# Patient Record
Sex: Male | Born: 1970
Health system: Southern US, Community
[De-identification: ages and names within clinical notes are randomized; demographics above are authoritative.]

---

## 2002-08-08 ENCOUNTER — Encounter: Payer: Self-pay | Admitting: *Deleted

## 2002-08-08 ENCOUNTER — Ambulatory Visit (HOSPITAL_COMMUNITY): Admission: RE | Admit: 2002-08-08 | Discharge: 2002-08-08 | Payer: Self-pay | Admitting: *Deleted

## 2002-10-18 ENCOUNTER — Inpatient Hospital Stay (HOSPITAL_COMMUNITY): Admission: RE | Admit: 2002-10-18 | Discharge: 2002-10-23 | Payer: Self-pay | Admitting: Neurosurgery

## 2002-10-18 ENCOUNTER — Encounter: Payer: Self-pay | Admitting: Neurosurgery

## 2017-05-05 ENCOUNTER — Other Ambulatory Visit: Payer: Self-pay | Admitting: Sports Medicine

## 2017-05-05 DIAGNOSIS — D369 Benign neoplasm, unspecified site: Secondary | ICD-10-CM

## 2017-05-05 DIAGNOSIS — M546 Pain in thoracic spine: Secondary | ICD-10-CM

## 2017-05-19 ENCOUNTER — Ambulatory Visit
Admission: RE | Admit: 2017-05-19 | Discharge: 2017-05-19 | Disposition: A | Discharge status: Home / Self Care | Payer: Self-pay | Source: Ambulatory Visit | Attending: Sports Medicine | Admitting: Sports Medicine

## 2017-05-19 DIAGNOSIS — M546 Pain in thoracic spine: Secondary | ICD-10-CM

## 2017-05-19 DIAGNOSIS — D369 Benign neoplasm, unspecified site: Secondary | ICD-10-CM

## 2017-05-19 MED ORDER — GADOBENATE DIMEGLUMINE 529 MG/ML IV SOLN
18.0000 mL | Freq: Once | INTRAVENOUS | Status: AC | PRN
  Administered 2017-05-19: 18 mL via INTRAVENOUS

## 2017-06-09 ENCOUNTER — Other Ambulatory Visit: Payer: Self-pay | Admitting: Neurological Surgery

## 2017-06-09 DIAGNOSIS — M545 Low back pain: Secondary | ICD-10-CM

## 2017-06-24 ENCOUNTER — Ambulatory Visit
Admission: RE | Admit: 2017-06-24 | Discharge: 2017-06-24 | Disposition: A | Discharge status: Home / Self Care | Payer: 59 | Source: Ambulatory Visit | Attending: Neurological Surgery | Admitting: Neurological Surgery

## 2017-06-24 DIAGNOSIS — M545 Low back pain: Secondary | ICD-10-CM

## 2017-06-24 MED ORDER — GADOBENATE DIMEGLUMINE 529 MG/ML IV SOLN
17.0000 mL | Freq: Once | INTRAVENOUS | Status: AC | PRN
  Administered 2017-06-24: 17 mL via INTRAVENOUS

## 2018-05-15 ENCOUNTER — Telehealth: Payer: Self-pay | Admitting: Psychiatry

## 2018-05-15 DIAGNOSIS — F3341 Major depressive disorder, recurrent, in partial remission: Secondary | ICD-10-CM

## 2018-05-15 MED ORDER — VILAZODONE HCL 40 MG PO TABS: 40.0000 mg | ORAL_TABLET | Freq: Every evening | ORAL | 1 refills | Status: DC

## 2018-05-15 NOTE — Telephone Encounter (Signed)
Patient reports pharmacy or office are confused about his Viibryd needing refill 40 mg nightly as a 90-day supply and 1 refill sent to [name] for interim to next appointment, with no other concerns or complaints.

## 2018-05-30 ENCOUNTER — Encounter: Payer: Self-pay | Admitting: Emergency Medicine

## 2018-05-30 DIAGNOSIS — F5081 Binge eating disorder: Secondary | ICD-10-CM

## 2018-05-30 DIAGNOSIS — G4700 Insomnia, unspecified: Secondary | ICD-10-CM

## 2018-05-30 DIAGNOSIS — F3341 Major depressive disorder, recurrent, in partial remission: Secondary | ICD-10-CM

## 2018-05-30 DIAGNOSIS — F5105 Insomnia due to other mental disorder: Secondary | ICD-10-CM | POA: Insufficient documentation

## 2018-05-30 DIAGNOSIS — F50819 Binge eating disorder, unspecified: Secondary | ICD-10-CM

## 2018-05-30 DIAGNOSIS — F331 Major depressive disorder, recurrent, moderate: Secondary | ICD-10-CM | POA: Insufficient documentation

## 2018-05-30 DIAGNOSIS — F411 Generalized anxiety disorder: Secondary | ICD-10-CM

## 2018-05-30 DIAGNOSIS — F330 Major depressive disorder, recurrent, mild: Secondary | ICD-10-CM | POA: Insufficient documentation

## 2018-06-05 ENCOUNTER — Ambulatory Visit: Payer: Self-pay | Admitting: Psychiatry

## 2018-06-09 ENCOUNTER — Other Ambulatory Visit: Payer: Self-pay | Admitting: Psychiatry

## 2018-06-14 ENCOUNTER — Other Ambulatory Visit: Payer: Self-pay | Admitting: Psychiatry

## 2018-06-14 ENCOUNTER — Encounter: Payer: Self-pay | Admitting: Psychiatry

## 2018-06-14 ENCOUNTER — Ambulatory Visit (INDEPENDENT_AMBULATORY_CARE_PROVIDER_SITE_OTHER): Payer: 59 | Admitting: Psychiatry

## 2018-06-14 VITALS — BP 104/72 | HR 68 | Ht 72.0 in | Wt 210.0 lb

## 2018-06-14 DIAGNOSIS — F5081 Binge eating disorder: Secondary | ICD-10-CM | POA: Insufficient documentation

## 2018-06-14 DIAGNOSIS — G4700 Insomnia, unspecified: Secondary | ICD-10-CM | POA: Diagnosis not present

## 2018-06-14 DIAGNOSIS — F5082 Avoidant/restrictive food intake disorder: Secondary | ICD-10-CM | POA: Diagnosis not present

## 2018-06-14 DIAGNOSIS — F411 Generalized anxiety disorder: Secondary | ICD-10-CM

## 2018-06-14 DIAGNOSIS — F5105 Insomnia due to other mental disorder: Secondary | ICD-10-CM

## 2018-06-14 DIAGNOSIS — F3341 Major depressive disorder, recurrent, in partial remission: Secondary | ICD-10-CM

## 2018-06-14 MED ORDER — ZOLPIDEM TARTRATE 10 MG PO TABS: 10.0000 mg | ORAL_TABLET | Freq: Every day | ORAL | 1 refills | Status: DC

## 2018-06-14 MED ORDER — VILAZODONE HCL 40 MG PO TABS: 40.0000 mg | ORAL_TABLET | Freq: Every evening | ORAL | 1 refills | Status: DC

## 2018-06-14 MED ORDER — BUSPIRONE HCL 30 MG PO TABS: 30.0000 mg | ORAL_TABLET | Freq: Two times a day (BID) | ORAL | 1 refills | Status: DC

## 2018-06-14 MED ORDER — ALPRAZOLAM 1 MG PO TABS: 1.0000 mg | ORAL_TABLET | Freq: Four times a day (QID) | ORAL | 1 refills | Status: DC | PRN

## 2018-06-14 NOTE — Progress Notes (Signed)
[facility]  Patient ID: Mario Briggs,  MRN: [id number]  PCP: Suzanna Obey, MD  Date of Evaluation: 06/14/2018 Time spent:20 minutes  Chief Complaint:  Chief Complaint    Anxiety; Depression; Eating Disorder      HISTORY/CURRENT STATUS: Mario Briggs is seen individually face-to-face with consent without collateral for psychiatric interview and exam in 29-month evaluation and management of anxiety, depression, insomnia, and eating disorder symptoms now fluctuating again.  Overall he is active and comfortable in the lobby and office today apparently texting wife Mario Briggs who is under the weather and unable to attend.  He clarifies that she is comfortable with the patient's treatment now, including that he has stopped his Latuda 10 mg nightly in the interim 2 months as stabilization of the cognitive dysphoria and anxiety have been achieved and the medication may render loss of control of appetite becoming difficult.  He states in the office today he is just thinking about when he can get home for cake and ice cream.  They are not cooking meals significantly, but his weight is stable being up 1 pound over last visit.  He has improved self-control and cognitive processing, while clarifying that he and Mario Briggs are often fused in their problem so that they can help each other but they also may simultaneously get worse at times.  He needs refills at least on Xanax and [facility]ien [facility] and has improved his anxiety and depression.  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  Anxiety  Presents for follow-up visit. Symptoms include compulsions, excessive worry and insomnia. Patient reports no chest pain, confusion, decreased concentration, depressed mood, dizziness, dry mouth, feeling of choking, hyperventilation, impotence, irritability, malaise, muscle  tension, nausea, nervous/anxious behavior, obsessions, palpitations, panic, restlessness, shortness of breath or suicidal ideas. Symptoms occur occasionally. The most recent episode lasted 2 hours. The severity of symptoms is mild. The patient sleeps 6 hours per night. The quality of sleep is fair. Nighttime awakenings: several.   His past medical history is significant for depression. There is no history of suicide attempts. Compliance with medications is 76-100%. Side effects of treatment include joint pain.  Depression       The patient presents with depression.  This is a recurrent problem.  The current episode started more than 1 year ago.   The onset quality is sudden.   The problem occurs intermittently.  The most recent episode lasted 6 weeks.    The problem has been gradually improving since onset.  Associated symptoms include insomnia and appetite change.  Associated symptoms include no decreased concentration, no fatigue, no helplessness, no hopelessness, not irritable, no restlessness, no decreased interest, no body aches, no myalgias, no headaches, no indigestion, not sad and no suicidal ideas.     The symptoms are aggravated by work stress, social issues and family issues.  Past treatments include SSRIs - Selective serotonin reuptake inhibitors and other medications.  Compliance with treatment is good.  Past compliance problems include difficulty with treatment plan, medication issues and medical issues.  Previous treatment provided significant relief.  Risk factors include a change in medication usage/dosage, family history of mental illness, family violence, major life event and stress.   Past medical history includes chronic illness, brain trauma, anxiety, eating disorder, depression and mental health disorder.     Pertinent negatives include no chronic fatigue  syndrome, no chronic pain, no fibromyalgia, no hypothyroidism, no thyroid problem, no recent illness, no life-threatening condition, no  physical disability, no terminal illness, no recent psychiatric admission, no Alzheimer's disease, no dementia, no bipolar disorder, no obsessive-compulsive disorder, no post-traumatic stress disorder, no schizophrenia, no suicide attempts and no head trauma.   Individual Medical History/ Review of Systems: Changes? :Yes .  Allergic rhinitis with eustachian tube dysfunction is more stable now with the cold weather.  Eyeglasses are unchanged.  Night sweats are now resolving as weight is more stable, slightly overweight but at his best in a long time, still with urge to binge eat but not restricting or avoiding lately.  DJD of the thoracic spine is continuing including postop resection of spinal tumor.  Allergies: Patient has no allergy information on record.  Current Medications:  Current Outpatient Medications:  .  ALPRAZolam (XANAX) 1 MG tablet, Take 1 tablet (1 mg total) by mouth 4 (four) times daily as needed for anxiety., Disp: 90 tablet, Rfl: 1 .  lurasidone (LATUDA) 40 MG TABS tablet, Take 10 mg by mouth daily with supper., Disp: , Rfl:  .  busPIRone (BUSPAR) 30 MG tablet, Take 1 tablet (30 mg total) by mouth 2 (two) times daily., Disp: 180 tablet, Rfl: 1 .  Vilazodone HCl (VIIBRYD) 40 MG TABS, Take 1 tablet (40 mg total) by mouth Nightly., Disp: 90 tablet, Rfl: 1 .  zolpidem (AMBIEN) 10 MG tablet, Take 1 tablet (10 mg total) by mouth at bedtime., Disp: 90 tablet, Rfl: 1 Medication Side Effects: none  Family Medical/ Social History: Changes? Yes . Death of his last wife was in [place] in their travels.  He fuses with current wife who has two thirds of her coronary arteries blocked but adequate collaterals so that cardiologist give her a good bill of health currently.  Brother has addiction and watches over mother hours away patient when mother is enabling but becomes the victim of brother's crime and violence.  MENTAL HEALTH EXAM: AIMS equals 0 of Latuda 1 month, postural reflexes 0/0, and  muscle strength 5/5. Blood pressure 104/72, pulse 68, height 6' (1.829 m), weight 210 lb (95.3 kg).Body mass index is 28.48 kg/m.  General Appearance: Casual and Fairly Groomed  Eye Contact:  Good  Speech:  Clear and Coherent  Volume:  Normal  Mood:  Anxious and Euthymic  Affect:  Constricted and Anxious  Thought Process:  Coherent and Goal Directed  Orientation:  Full (Time, Place, and Person)  Thought Content: Obsessions   Suicidal Thoughts:  No  Homicidal Thoughts:  No  Memory:  Remote;   Good  Judgement:  Intact  Insight:  Fair  Psychomotor Activity:  Normal  Concentration:  Concentration: Good and Attention Span: Good  Recall:  Good  Fund of Knowledge: Good  Language: Good  Assets:  Desire for Improvement Talents/Skills Vocational/Educational  ADL's:  Intact  Cognition: WNL  Prognosis:  Fair    DIAGNOSES:    ICD-10-CM   1. Major depressive disorder, recurrent episode, in partial remission ([place]) F33.41   2. Generalized anxiety disorder F41.1 ALPRAZolam (XANAX) 1 MG tablet    busPIRone (BUSPAR) 30 MG tablet  3. Avoidant-restrictive food intake disorder (ARFID) F50.82   4. Insomnia disorder, with non-sleep disorder mental comorbidity, persistent G47.00 zolpidem (AMBIEN) 10 MG tablet    ALPRAZolam (XANAX) 1 MG tablet  5. Depression, major, recurrent, in partial remission (HCC) F33.41 Vilazodone HCl (VIIBRYD) 40 MG TABS    Receiving Psychotherapy: No   Previously  seeing George Hugh, PhD then later the [facility]    RECOMMENDATIONS: Latuda discontinuation was therapeutically appropriate and is not replaced or restarted.  He continues Viibryd 40 mg nightly for depression and anxiety, Ambien 10 mg nightly for insomnia, Xanax 1 mg 4 times daily as needed for anxiety and insomnia, and BuSpar 30 mg twice daily for anxiety and depression, as a 90-day supply and 3 refills each [facility] to [facility] on [address]. He returns in 4 months. Psychotherapeutic integration of all elements of  past and current treatment are planning for his future today.  Nutritional interventions and monitoring are provided as well as relationship goals and mechanisms for marriage and work.   Delight Hoh, MD

## 2018-06-15 ENCOUNTER — Telehealth: Payer: Self-pay | Admitting: Psychiatry

## 2018-06-15 DIAGNOSIS — G4700 Insomnia, unspecified: Secondary | ICD-10-CM

## 2018-06-15 DIAGNOSIS — F411 Generalized anxiety disorder: Secondary | ICD-10-CM

## 2018-06-15 DIAGNOSIS — F5105 Insomnia due to other mental disorder: Secondary | ICD-10-CM

## 2018-06-15 NOTE — Telephone Encounter (Signed)
Pt called.  Xanax refill for 3 months was 90 tablets instead of 120.

## 2018-06-18 MED ORDER — ALPRAZOLAM 1 MG PO TABS: 1.0000 mg | ORAL_TABLET | Freq: Four times a day (QID) | ORAL | 1 refills | Status: DC | PRN

## 2018-06-18 NOTE — Telephone Encounter (Signed)
Mario Briggs phones 06/15/2018 that his 90-day prescription with 1 refill for alprazolam was sent as 90 tablets needing updated prescription to [facility].  Xanax 1 mg 4 times daily #360 with 1 refill sent to [facility].

## 2018-10-11 ENCOUNTER — Ambulatory Visit: Payer: 59 | Admitting: Psychiatry

## 2018-10-16 ENCOUNTER — Encounter: Payer: Self-pay | Admitting: Psychiatry

## 2018-10-16 ENCOUNTER — Ambulatory Visit (INDEPENDENT_AMBULATORY_CARE_PROVIDER_SITE_OTHER): Payer: 59 | Admitting: Psychiatry

## 2018-10-16 ENCOUNTER — Other Ambulatory Visit: Payer: Self-pay

## 2018-10-16 VITALS — BP 104/70 | HR 84 | Ht 72.0 in | Wt 210.0 lb

## 2018-10-16 DIAGNOSIS — F411 Generalized anxiety disorder: Secondary | ICD-10-CM

## 2018-10-16 DIAGNOSIS — F3341 Major depressive disorder, recurrent, in partial remission: Secondary | ICD-10-CM

## 2018-10-16 DIAGNOSIS — F5082 Avoidant/restrictive food intake disorder: Secondary | ICD-10-CM

## 2018-10-16 DIAGNOSIS — G4700 Insomnia, unspecified: Secondary | ICD-10-CM

## 2018-10-16 DIAGNOSIS — F5105 Insomnia due to other mental disorder: Secondary | ICD-10-CM

## 2018-10-16 MED ORDER — VILAZODONE HCL 40 MG PO TABS: 40.0000 mg | ORAL_TABLET | Freq: Every day | ORAL | 0 refills | Status: DC

## 2018-10-16 MED ORDER — ALPRAZOLAM 1 MG PO TABS: 1.0000 mg | ORAL_TABLET | Freq: Four times a day (QID) | ORAL | 0 refills | Status: DC | PRN

## 2018-10-16 MED ORDER — BUSPIRONE HCL 30 MG PO TABS: 30.0000 mg | ORAL_TABLET | Freq: Two times a day (BID) | ORAL | 0 refills | Status: DC

## 2018-10-16 MED ORDER — ZOLPIDEM TARTRATE 10 MG PO TABS: 10.0000 mg | ORAL_TABLET | Freq: Every day | ORAL | 3 refills | Status: DC

## 2018-10-16 NOTE — Progress Notes (Signed)
[facility]  Patient ID: Mario Briggs,  MRN: [id number]  PCP: Suzanna Obey, MD  Date of Evaluation: 10/16/2018 Time spent:20 minutes  Chief Complaint:  Chief Complaint    Anxiety; Depression; Eating Disorder      HISTORY/CURRENT STATUS: Mario Briggs is seen conjointly with wife Mario Briggs face-to-face with consent not collateral for psychiatric interview and exam in 17-month evaluation and management of nature depression, generalized anxiety, and eating disorder all with insomnia.  With his improvement, wife is more secure and less disruptive in his overall treatment.  She continues to give him advice about his job when he is coping reasonably well.  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  He has no night sweats or arthritic symptoms now with weight improvement.  He is now off of Latuda for 6 months and took Anafranil only 4 months after brief Remeron was unsuccessful.  He has no psychosis, mania, suicidality, or dissociation.  Depression       The patient presents with depression.  This is a recurrent problem.  The current episode started more than 1 month ago.   The onset quality is sudden.   The problem occurs rarely.  The problem has been gradually improving since onset.  Associated symptoms include fatigue, insomnia and sad.  Associated symptoms include no decreased concentration, no helplessness, no hopelessness, not irritable, no decreased interest, no appetite change, no indigestion and no suicidal ideas.     The symptoms are aggravated by work stress, social issues and medication withdrawal.  Past treatments include SSRIs - Selective serotonin reuptake inhibitors, other medications, psychotherapy, SNRIs - Serotonin and norepinephrine reuptake inhibitors and TCAs - Tricyclic antidepressants.  Compliance with treatment is variable and good.  Past compliance problems include difficulty with treatment plan, medication  issues and medical issues.  Risk factors include family history, family history of mental illness, family violence, history of mental illness, history of self-injury, major life event, prior traumatic experience and stress.   Past medical history includes anxiety, eating disorder, depression and mental health disorder.     Pertinent negatives include no recent illness, no life-threatening condition, no physical disability, no recent psychiatric admission, no bipolar disorder, no obsessive-compulsive disorder, no post-traumatic stress disorder, no schizophrenia, no suicide attempts and no head trauma.   Individual Medical History/ Review of Systems: Changes? :Yes weight did increase 20 pounds to 230 pounds by binge eating mostly in the evening then restricting again so that he is back down to 210 pounds with mental health consequences of avoidant restricting more severe than binge eating.  Allergies: Patient has no known allergies.  Current Medications:  Current Outpatient Medications:  .  ALPRAZolam (XANAX) 1 MG tablet, Take 1 tablet (1 mg total) by mouth 4 (four) times daily as needed for anxiety., Disp: 360 tablet, Rfl: 1 .  busPIRone (BUSPAR) 30 MG tablet, Take 1 tablet (30 mg total) by mouth 2 (two) times daily., Disp: 180 tablet, Rfl: 1 .  lurasidone (LATUDA) 40 MG TABS tablet, Take 10 mg by mouth daily with supper., Disp: , Rfl:  .  Vilazodone HCl (VIIBRYD) 40 MG TABS, Take 1 tablet (40 mg total) by mouth Nightly., Disp: 90 tablet, Rfl: 1 .  zolpidem (AMBIEN) 10 MG tablet, Take 1 tablet (10 mg total) by mouth at bedtime., Disp: 90 tablet, Rfl: 1 Medication Side Effects: none  Family Medical/ Social History: Changes? 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  MENTAL HEALTH EXAM: Muscle strengths and tone 5/5, postural reflexes and gait 0/0,  and AIMS = 0. Blood pressure 104/70, pulse 84, height 6' (1.829 m), weight 210 lb (95.3 kg).Body mass index is 28.48 kg/m.  General Appearance: Casual and Fairly Groomed  Eye Contact:  Good  Speech:  Clear and Coherent, Normal Rate and Talkative  Volume:  Normal  Mood:  Anxious, Dysphoric, Euthymic and Worthless  Affect:  Congruent, Depressed, Full Range and Anxious  Thought Process:  Goal Directed and Irrelevant  Orientation:  Full (Time, Place, and Person)  Thought Content: Logical, Obsessions and Rumination   Suicidal Thoughts:  No  Homicidal Thoughts:  No  Memory:  Immediate;   Good Remote;   Good  Judgement:  Good  Insight:  Fair  Psychomotor Activity:  Normal, Increased, Decreased and Mannerisms  Concentration:  Concentration: Good and Attention Span: Good  Recall:  Good  Fund of Knowledge: Fair  Language: Good  Assets:  Leisure Time Resilience Talents/Skills  ADL's:  Intact  Cognition: WNL  Prognosis:  Good    DIAGNOSES:    ICD-10-CM   1. Major depressive disorder, recurrent episode, in partial remission ([place]) F33.41   2. Generalized anxiety disorder F41.1   3. Insomnia disorder, with non-sleep disorder mental comorbidity, persistent G47.00   4. Avoidant-restrictive food intake disorder (ARFID) F50.82     Receiving Psychotherapy: No Last with the [facility]   RECOMMENDATIONS: Elek and wife establish coping and understanding for skills to project continued satisfactory containment of mental illness for the next 4 months.  He is escribed Ambien 10 mg every bedtime #30 with 3 refills for insomnia with non-sleep disorder mental comorbidity.  E scribed to continue Viibryd 40 mg every evening meal #90 with no refill and BuSpar 30 mg twice daily 180 with no refill to [facility] on [address] in [facility] for major depression, generalized anxiety, combined ARFID and binge eating disorders.  He is E scribed to continue Xanax 1 mg 4 times daily as needed for anxiety #360 with no  refills to [facility].  He returns in 4 months.   Delight Hoh, MD

## 2019-02-04 ENCOUNTER — Other Ambulatory Visit: Payer: Self-pay | Admitting: Psychiatry

## 2019-02-04 DIAGNOSIS — F411 Generalized anxiety disorder: Secondary | ICD-10-CM

## 2019-02-04 DIAGNOSIS — F5105 Insomnia due to other mental disorder: Secondary | ICD-10-CM

## 2019-02-05 NOTE — Telephone Encounter (Signed)
Last fill 06/10 #120   Has appt 07/08

## 2019-02-06 ENCOUNTER — Encounter: Payer: Self-pay | Admitting: Psychiatry

## 2019-02-06 ENCOUNTER — Other Ambulatory Visit: Payer: Self-pay

## 2019-02-06 ENCOUNTER — Ambulatory Visit (INDEPENDENT_AMBULATORY_CARE_PROVIDER_SITE_OTHER): Payer: 59 | Admitting: Psychiatry

## 2019-02-06 VITALS — BP 125/77 | HR 69 | Ht 72.0 in | Wt 182.0 lb

## 2019-02-06 DIAGNOSIS — F5105 Insomnia due to other mental disorder: Secondary | ICD-10-CM

## 2019-02-06 DIAGNOSIS — F3341 Major depressive disorder, recurrent, in partial remission: Secondary | ICD-10-CM | POA: Diagnosis not present

## 2019-02-06 DIAGNOSIS — F5082 Avoidant/restrictive food intake disorder: Secondary | ICD-10-CM | POA: Diagnosis not present

## 2019-02-06 DIAGNOSIS — G4700 Insomnia, unspecified: Secondary | ICD-10-CM

## 2019-02-06 DIAGNOSIS — F411 Generalized anxiety disorder: Secondary | ICD-10-CM

## 2019-02-06 DIAGNOSIS — G2581 Restless legs syndrome: Secondary | ICD-10-CM | POA: Insufficient documentation

## 2019-02-06 MED ORDER — ZOLPIDEM TARTRATE 10 MG PO TABS: 10.0000 mg | ORAL_TABLET | Freq: Every day | ORAL | 0 refills | Status: DC

## 2019-02-06 MED ORDER — BUSPIRONE HCL 30 MG PO TABS: 30.0000 mg | ORAL_TABLET | Freq: Two times a day (BID) | ORAL | 0 refills | Status: DC

## 2019-02-06 MED ORDER — VILAZODONE HCL 40 MG PO TABS: 40.0000 mg | ORAL_TABLET | Freq: Every day | ORAL | 0 refills | Status: DC

## 2019-02-06 NOTE — Progress Notes (Signed)
[facility]  Patient ID: Mario Briggs,  MRN: [id number]  PCP: Suzanna Obey, MD  Date of Evaluation: 02/06/2019 Time spent:20 minutes from 1120 to 1140  Chief Complaint:  Chief Complaint    Follow-up; Anxiety; Depression; Eating Disorder; Stress; Weight Loss      HISTORY/CURRENT STATUS: Mario Briggs is seen onsite in office face-to-face conjointly with [name] with consent with epic collateral for psychiatric interview and exam in 39-month evaluation and management of depression and anxiety with exacerbation of ARFID and insomnia now possibly restless leg syndrome.  The patient offers gratitude that he has a job when usually he and wife discount his job feeling unfairly treated.  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  He thereby concludes the job is very difficult but with gratitude.  Chief concern at this time is moving in his sleep he and wife consider restless leg syndrome at night only attempting to wear compression stockings but not tolerated over his feet.  He has newly started some iron from PCP that may be helpful though he hesitates to look at the primary cause as likely having weight reduction of 28 pounds in the last 4 months which shocks wife but they err on the side of losing more weight.  He does take his Ambien at night as well as his Xanax through the day.  He takes Viibryd every evening meal 40 mg and takes his BuSpar 30 mg twice daily.  We just sent to the pharmacy July 7 Xanax in 30-month supply.  He and wife decline to consider Neurontin for his restless leg symptoms and his anxiety possibly in place of Ambien, preferring to continue his current regimen and maybe take Xanax in the evening before bedtime in order to be calm for sleep.  Weight has dropped as low as 174 in the past while being as high as 333 pounds.  He is apprehensive that he will overeat and does tend to  binge eat when he allows himself.  He has no mania, suicidality, psychosis, or delirium.  Depression        The patient presents with depression as a recurrent problem.  The current episode started more than 1 month ago.   The onset quality is sudden.   The problem occurs rarely.  The problem has been gradually improving since onset.  Associated symptoms include fatigue, insomnia and sad.  Associated symptoms include no decreased concentration, no helplessness, no hopelessness, not irritable, no decreased interest, no appetite change, no indigestion and no suicidal ideas.     The symptoms are aggravated by work stress, social issues and medication withdrawal.  Past treatments include SSRIs - Selective serotonin reuptake inhibitors, other medications, psychotherapy, SNRIs - Serotonin and norepinephrine reuptake inhibitors and TCAs - Tricyclic antidepressants.  Compliance with treatment is variable and good.  Past compliance problems include difficulty with treatment plan, medication issues and medical issues.  Risk factors include family history, family history of mental illness, family violence, history of mental illness, history of self-injury, major life event, prior traumatic experience and stress.   Past medical history includes anxiety, eating disorder, depression and mental health disorder.     Pertinent negatives include no recent illness, no life-threatening condition, no physical disability, no recent psychiatric admission, no bipolar disorder, no obsessive-compulsive disorder, no post-traumatic stress disorder, no schizophrenia, no suicide attempts and no head trauma.  Individual Medical History/ Review of Systems: Changes? :Yes He has seen  PCP for weight loss and restless leg symptoms treated with iron but stressed that PCP considers current parameters of health acceptable when patient still feels the need to change his physical health.  Allergies: Patient has no known allergies.  Current  Medications:  Current Outpatient Medications:  .  ALPRAZolam (XANAX) 1 MG tablet, TAKE 1 TABLET(1 MG) BY MOUTH FOUR TIMES DAILY AS NEEDED FOR ANXIETY, Disp: 360 tablet, Rfl: 0 .  busPIRone (BUSPAR) 30 MG tablet, Take 1 tablet (30 mg total) by mouth 2 (two) times daily., Disp: 180 tablet, Rfl: 0 .  Vilazodone HCl (VIIBRYD) 40 MG TABS, Take 1 tablet (40 mg total) by mouth daily after supper., Disp: 90 tablet, Rfl: 0 .  zolpidem (AMBIEN) 10 MG tablet, Take 1 tablet (10 mg total) by mouth at bedtime., Disp: 90 tablet, Rfl: 0 .  finasteride (PROSCAR) 5 MG tablet, Take 5 mg by mouth., Disp: , Rfl:  .  tamsulosin (FLOMAX) 0.4 MG CAPS capsule, Take 0.4 mg by mouth., Disp: , Rfl:    Medication Side Effects: none  Family Medical/ Social History: Changes? No  MENTAL HEALTH EXAM:  Blood pressure 125/77, pulse 69, height 6' (1.829 m), weight 182 lb (82.6 kg).Body mass index is 24.68 kg/m.  Others deferred as nonessential in coronavirus pandemic obviously stressful to patient and wife today  General Appearance: Casual, Fairly Groomed and Guarded  Eye Contact:  Fair  Speech:  Clear and Coherent, Normal Rate and Talkative  Volume:  Normal  Mood:  Anxious, Depressed, Dysphoric, Euthymic and Worthless  Affect:  Depressed, Inappropriate, Restricted and Anxious  Thought Process:  Coherent, Goal Directed and Irrelevant  Orientation:  Full (Time, Place, and Person)  Thought Content: Ilusions, Obsessions and Rumination   Suicidal Thoughts:  No  Homicidal Thoughts:  No  Memory:  Immediate;   Good Remote;   Fair  Judgement:  Fair to limited  Insight:  Fair  Psychomotor Activity:  Normal, Decreased and Mannerisms  Concentration:  Concentration: Fair and Attention Span: Good  Recall:  Good  Fund of Knowledge: Good  Language: Good  Assets:  Desire for Improvement Resilience Vocational/Educational  ADL's:  Intact  Cognition: WNL  Prognosis:  Fair    DIAGNOSES:    ICD-10-CM   1. Major depressive  disorder, recurrent episode, in partial remission (HCC)  F33.41 Vilazodone HCl (VIIBRYD) 40 MG TABS    busPIRone (BUSPAR) 30 MG tablet  2. Generalized anxiety disorder  F41.1 Vilazodone HCl (VIIBRYD) 40 MG TABS    busPIRone (BUSPAR) 30 MG tablet  3. Insomnia disorder, with non-sleep disorder mental comorbidity, persistent  G47.00 zolpidem (AMBIEN) 10 MG tablet  4. Avoidant-restrictive food intake disorder (ARFID)  F50.82 Vilazodone HCl (VIIBRYD) 40 MG TABS  5. Restless legs syndrome  G25.81     Receiving Psychotherapy: No    RECOMMENDATIONS: Janiel continues his vitamins including iron and vitamin C.  Liberalization of diet is pragmatically processed for physical comfort and motoric function.  He may call in the interim for gabapentin if needed and agrees to follow-up in 2 months.  Buspar is sent as 30 mg twice daily as a 90-day supply to [facility] on [address] in [facility] for anxiety and Xanax 1 mg 4 times daily as needed for anxiety was sent the day before 7/7/202 as a 53-month supply for anxiety.  Viibryd is also sent that 02/06/2019 as 40 mg daily after supper taking with meal as a 31-month supply for depression and anxiety. Ambien 10 mg nightly #90 with  no refill is escribed to [facility] [address] for insomnia and anxiety.  He plans follow-up in 2 months.  Delight Hoh, MD

## 2019-02-14 ENCOUNTER — Ambulatory Visit: Payer: 59 | Admitting: Psychiatry

## 2019-02-22 ENCOUNTER — Other Ambulatory Visit: Payer: Self-pay | Admitting: Psychiatry

## 2019-02-22 DIAGNOSIS — F411 Generalized anxiety disorder: Secondary | ICD-10-CM

## 2019-02-22 DIAGNOSIS — F3341 Major depressive disorder, recurrent, in partial remission: Secondary | ICD-10-CM

## 2019-03-02 ENCOUNTER — Other Ambulatory Visit: Payer: Self-pay | Admitting: Psychiatry

## 2019-03-02 DIAGNOSIS — F3341 Major depressive disorder, recurrent, in partial remission: Secondary | ICD-10-CM

## 2019-03-02 DIAGNOSIS — F411 Generalized anxiety disorder: Secondary | ICD-10-CM

## 2019-03-02 DIAGNOSIS — F5082 Avoidant/restrictive food intake disorder: Secondary | ICD-10-CM

## 2019-03-26 ENCOUNTER — Encounter: Payer: Self-pay | Admitting: Psychiatry

## 2019-03-26 ENCOUNTER — Ambulatory Visit (INDEPENDENT_AMBULATORY_CARE_PROVIDER_SITE_OTHER): Payer: 59 | Admitting: Psychiatry

## 2019-03-26 ENCOUNTER — Other Ambulatory Visit: Payer: Self-pay

## 2019-03-26 VITALS — Ht 72.0 in | Wt 197.0 lb

## 2019-03-26 DIAGNOSIS — G4700 Insomnia, unspecified: Secondary | ICD-10-CM | POA: Diagnosis not present

## 2019-03-26 DIAGNOSIS — F330 Major depressive disorder, recurrent, mild: Secondary | ICD-10-CM | POA: Diagnosis not present

## 2019-03-26 DIAGNOSIS — F411 Generalized anxiety disorder: Secondary | ICD-10-CM

## 2019-03-26 DIAGNOSIS — F5082 Avoidant/restrictive food intake disorder: Secondary | ICD-10-CM

## 2019-03-26 DIAGNOSIS — F5105 Insomnia due to other mental disorder: Secondary | ICD-10-CM

## 2019-03-26 MED ORDER — BUPROPION HCL ER (XL) 150 MG PO TB24: 150.0000 mg | ORAL_TABLET | Freq: Every day | ORAL | 0 refills | Status: DC

## 2019-03-26 NOTE — Progress Notes (Signed)
[facility]  Patient ID: Mario Briggs,  MRN: [id number]  PCP: Suzanna Obey, MD  Date of Evaluation: 03/26/2019 Time spent:20 minutes from 1325 to 21  Chief Complaint:  Chief Complaint    Depression; Anxiety; Eating Disorder      HISTORY/CURRENT STATUS: Mario Briggs is seen onsite in office face-to-face conjointly with his [name] with consent with epic collateral fof psychiatric interview and exam in 43-month evaluation and management of generalized anxiety, major depression, ARFID, and insomnia not likely RLS.  The patient attempts to distinguish today between genuine despair for economics, politics, and justice in our country attempting to limit the influence upon him  by the [employer].  He wants to exercise again. By gaining 15 pounds over 2 months, he looks more healthy and no longer has restless legs overall sleeping better but having night sweats.  He has new assignment on the job and worries that he will be laid off.  He wants to be happier but does not know if it is proper to expect such at this time in history.  He would hope to sweat less but still get on the exercise bike again, not only initiating such but sustaining such.  Medications are more consolidated and specific than in the last 12 years having taken Wellbutrin in the past for 6 years now off for 5 years when he now needs help with energy, motivation, control of portion size in his diet, and possibly help night sweats.  He has no mania, psychosis, suicidality or delirium.   Depression        The patient presents withrecurrent depression as a problem.The current episode started 2 to 4 weeks ago. The onset quality is sudden. The problem occurs rarely.The problem has been gradually improvingsince onset.Associated symptoms include fatigue,insomnia, disinterest,and sad. Associated symptoms include no decreased concentration,no helplessness,no hopelessness,not irritable,no decreased interest,no  appetite change,no indigestionand no suicidal ideas.The symptoms are aggravated by work stress, social issues and medication withdrawal.Past treatments include SSRIs - Selective serotonin reuptake inhibitors, other medications, psychotherapy, SNRIs - Serotonin and norepinephrine reuptake inhibitors and TCAs - Tricyclic antidepressants.Compliance with treatment is variable and good.Past compliance problems include difficulty with treatment plan, medication issues and medical issues.Risk factors include family history, family history of mental illness, family violence, history of mental illness, history of self-injury, major life event, prior traumatic experience and stress. Past medical history includes anxiety,eating disorder,depressionand mental health disorder. Pertinent negatives include no recent illness,no life-threatening condition,no physical disability,no recent psychiatric admission,no bipolar disorder,no obsessive-compulsive disorder,no post-traumatic stress disorder,no schizophrenia,no suicide attemptsand no head trauma.  Individual Medical History/ Review of Systems: Changes? :Yes He is taking iron supplement and potassium as suggested by PCP including in reference to his night sweats are episodic occurring for years ago and now again sometimes drenching him to need bed clothes changed.  Has gained 15 pounds in the last 6 weeks.  RLS symptoms are better with weight restoration and interim adaptations, also having Flomax for urinary stream.  He would like to get on the exercise bike again hoping he can gain the energy and initiative to start such.  Allergies: Patient has no known allergies.  Current Medications:  Current Outpatient Medications:  .  ALPRAZolam (XANAX) 1 MG tablet, TAKE 1 TABLET(1 MG) BY MOUTH FOUR TIMES DAILY AS NEEDED FOR ANXIETY, Disp: 360 tablet, Rfl: 0 .  buPROPion (WELLBUTRIN XL) 150 MG 24 hr tablet, Take 1 tablet (150 mg total) by mouth  daily., Disp: 90 tablet, Rfl: 0 .  busPIRone (  BUSPAR) 30 MG tablet, Take 1 tablet (30 mg total) by mouth 2 (two) times daily., Disp: 180 tablet, Rfl: 0 .  finasteride (PROSCAR) 5 MG tablet, Take 5 mg by mouth., Disp: , Rfl:  .  tamsulosin (FLOMAX) 0.4 MG CAPS capsule, Take 0.4 mg by mouth., Disp: , Rfl:  .  Vilazodone HCl (VIIBRYD) 40 MG TABS, Take 1 tablet (40 mg total) by mouth daily after supper., Disp: 90 tablet, Rfl: 0 .  zolpidem (AMBIEN) 10 MG tablet, Take 1 tablet (10 mg total) by mouth at bedtime., Disp: 90 tablet, Rfl: 0   Medication Side Effects: anxiety, constipation, fatigue/weakness, flushing and insomnia  Family Medical/ Social History: Changes? 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  Mario Briggs does worry that the company may have more layoffs and that they are shifting people around may serve to facilitate such so that he is attempting to economically prepare for any such change.  MENTAL HEALTH EXAM:  Height 6' (1.829 m), weight 197 lb (89.4 kg).Body mass index is 26.72 kg/m.  Others deferred for coronavirus pandemic  General Appearance: Casual, Fairly Groomed, Guarded and Meticulous  Eye Contact:  Good  Speech:  Clear and Coherent, Normal Rate and Talkative  Volume:  Normal  Mood:  Anxious, Depressed, Dysphoric, Euthymic and Worthless  Affect:  Congruent, Constricted, Depressed and Anxious  Thought Process:  Coherent, Goal Directed, Irrelevant and Linear  Orientation:  Full (Time, Place, and Person)  Thought Content: Ilusions, Obsessions and Rumination   Suicidal Thoughts:  No  Homicidal Thoughts:  No  Memory:  Immediate;   Good Remote;   Good  Judgement:  Fair  Insight:  Fair  Psychomotor Activity:  Normal, Decreased and Mannerisms  Concentration:  Concentration: Fair and Attention Span:  Good to fair  Recall:  [employer] of Knowledge: Good  Language: Good  Assets:  Desire for Improvement Intimacy Resilience Talents/Skills  ADL's:  Intact  Cognition: WNL  Prognosis:  Fair    DIAGNOSES:    ICD-10-CM   1. Mild recurrent major depression (HCC)  F33.0 buPROPion (WELLBUTRIN XL) 150 MG 24 hr tablet  2. Generalized anxiety disorder  F41.1   3. Avoidant-restrictive food intake disorder (ARFID)  F50.82   4. Insomnia disorder, with non-sleep disorder mental comorbidity, persistent  G47.00     Receiving Psychotherapy: No    RECOMMENDATIONS: Over 50% of the time is spent in counseling and coordination of care for symptom treatment matching prevention and monitoring that integrates with cognitive behavioral nutrition, sleep hygiene, exercise, and social skill problem-solving.  He is E scribed Wellbutrin 150 mg XL every morning as #90 with no refill to [facility] [address] [place] in [facility] for depression, generalized anxiety, and ARFID.  He continues his current supply of Xanax 1 mg 4 times daily as needed for generalized anxiety, BuSpar 30 mg twice daily for generalized anxiety and major depression, Viibryd 40 mg daily after supper for depression and anxiety and ARFID, and Ambien 10 mg nightly as needed for insomnia.  He returns in 2 months for follow-up to start his exercise and make his job position adjustment.  Delight Hoh, MD

## 2019-04-03 ENCOUNTER — Ambulatory Visit: Payer: 59 | Admitting: Psychiatry

## 2019-04-03 ENCOUNTER — Other Ambulatory Visit: Payer: Self-pay | Admitting: Psychiatry

## 2019-04-03 DIAGNOSIS — F5105 Insomnia due to other mental disorder: Secondary | ICD-10-CM

## 2019-04-05 ENCOUNTER — Other Ambulatory Visit: Payer: Self-pay | Admitting: Psychiatry

## 2019-04-05 ENCOUNTER — Telehealth: Payer: Self-pay | Admitting: Psychiatry

## 2019-04-05 DIAGNOSIS — F5105 Insomnia due to other mental disorder: Secondary | ICD-10-CM

## 2019-05-05 ENCOUNTER — Other Ambulatory Visit: Payer: Self-pay | Admitting: Psychiatry

## 2019-05-05 DIAGNOSIS — F5082 Avoidant/restrictive food intake disorder: Secondary | ICD-10-CM

## 2019-05-05 DIAGNOSIS — F411 Generalized anxiety disorder: Secondary | ICD-10-CM

## 2019-05-05 DIAGNOSIS — F3341 Major depressive disorder, recurrent, in partial remission: Secondary | ICD-10-CM

## 2019-05-08 ENCOUNTER — Other Ambulatory Visit: Payer: Self-pay | Admitting: Psychiatry

## 2019-05-08 DIAGNOSIS — F411 Generalized anxiety disorder: Secondary | ICD-10-CM

## 2019-05-08 DIAGNOSIS — F5105 Insomnia due to other mental disorder: Secondary | ICD-10-CM

## 2019-05-08 NOTE — Telephone Encounter (Signed)
appt 05/27/2019

## 2019-05-08 NOTE — Telephone Encounter (Signed)
[place] registry documents last Xanax fill 02/05/2019 with last appointment 03/26/2019 due end of this month needing 47-month supply of Xanax as of now 1 mg 4 times daily as needed #360 with no refill [facility] to [facility] [address] in [facility] necessary no contraindication.

## 2019-05-27 ENCOUNTER — Other Ambulatory Visit: Payer: Self-pay

## 2019-05-27 ENCOUNTER — Encounter: Payer: Self-pay | Admitting: Psychiatry

## 2019-05-27 ENCOUNTER — Ambulatory Visit (INDEPENDENT_AMBULATORY_CARE_PROVIDER_SITE_OTHER): Payer: 59 | Admitting: Psychiatry

## 2019-05-27 VITALS — Ht 72.0 in | Wt 215.0 lb

## 2019-05-27 DIAGNOSIS — F411 Generalized anxiety disorder: Secondary | ICD-10-CM | POA: Diagnosis not present

## 2019-05-27 DIAGNOSIS — F5082 Avoidant/restrictive food intake disorder: Secondary | ICD-10-CM | POA: Diagnosis not present

## 2019-05-27 DIAGNOSIS — F5105 Insomnia due to other mental disorder: Secondary | ICD-10-CM

## 2019-05-27 DIAGNOSIS — F330 Major depressive disorder, recurrent, mild: Secondary | ICD-10-CM | POA: Diagnosis not present

## 2019-05-27 MED ORDER — BUSPIRONE HCL 30 MG PO TABS: 30.0000 mg | ORAL_TABLET | Freq: Two times a day (BID) | ORAL | 0 refills | Status: DC

## 2019-05-27 MED ORDER — BUPROPION HCL ER (XL) 300 MG PO TB24: 300.0000 mg | ORAL_TABLET | Freq: Every day | ORAL | 0 refills | Status: DC

## 2019-05-27 MED ORDER — ZOLPIDEM TARTRATE 10 MG PO TABS: 10.0000 mg | ORAL_TABLET | Freq: Every day | ORAL | 0 refills | Status: DC

## 2019-05-27 NOTE — Progress Notes (Signed)
[facility]  Patient ID: Mario Briggs,  MRN: [id number]  PCP: Mario Obey, MD  Date of Evaluation: 05/27/2019 Time spent:20 minutes from 1105 to 1125  Chief Complaint:  Chief Complaint    Anxiety; Depression; Eating Disorder      HISTORY/CURRENT STATUS: Mario Briggs is seen onsite in office 20 minutes face-to-face conjointly with wife with consent with epic collateral for psychiatric interview and exam in 66-month evaluation and management of generalized anxiety, major depression, and ARFID.  Wellbutrin was added 150 mg XL every morning last session for mild involutional depressive symptoms, fatigue, low motivation, and sweating more at night but also diurnally.  He tolerated Wellbutrin for 6 years in the past though having none for the last 5 years.  He has gained 18 pounds since last visit when he gained 15 pounds over the preceding visit and always worries about excessive weight gain, though his weight had become too low with his restrictive eating.  He is bored and worried relative to his mother, the economy, potential job loss, and Covid.  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  He considers his company is doing adequately in the market place.  He is frightened about possibly dying and feels he needs to improve his health, though likely worries about wife even more.  He has 2 or 3 panic episodes weekly though they are not somatically severe.  He is seeing PCP for labs in the office.  He had postural dizziness orthostatic hypotension symptoms on one occasion.  He has bike riding and working from home.  He has no mania, suicidality, psychosis or delirium.   Depression The patient presents withrecurrent depressionas a recurrentproblem.The current episode started 3 months ago. The onset quality is sudden. The problem occurs daily.The problem has been gradually improvingsince  onset.Associated symptoms include fatigue,insomnia, disinterest,diaphoresis, anxiety and prepanic, and sad. Associated symptoms include no decreased concentration,no helplessness,no hopelessness,not irritable,no appetite change,no indigestionand no suicidal ideas.The symptoms are aggravated by work stress, social issues and medication withdrawal.Past treatments include SSRIs - Selective serotonin reuptake inhibitors, other medications, psychotherapy, SNRIs - Serotonin and norepinephrine reuptake inhibitors and TCAs - Tricyclic antidepressants.Compliance with treatment is variable and good.Past compliance problems include difficulty with treatment plan, medication issues and medical issues.Risk factors include family history, family history of mental illness, family violence, history of mental illness, history of self-injury, major life event, prior traumatic experience and stress. Past medical history includes anxiety,eating disorder,depressionand mental health disorder. Pertinent negatives include no recent illness,no life-threatening condition,no physical disability,no recent psychiatric admission,no bipolar disorder,no obsessive-compulsive disorder,no post-traumatic stress disorder,no schizophrenia,no suicide attemptsand no head trauma.  Individual Medical History/ Review of Systems: Changes? :Yes Seeing PCP planning labs to update health status.  Allergies: Patient has no known allergies.  Current Medications:  Current Outpatient Medications:  .  ALPRAZolam (XANAX) 1 MG tablet, TAKE 1 TABLET(1 MG) BY MOUTH FOUR TIMES DAILY AS NEEDED FOR ANXIETY, Disp: 360 tablet, Rfl: 0 .  buPROPion (WELLBUTRIN XL) 300 MG 24 hr tablet, Take 1 tablet (300 mg total) by mouth daily after breakfast., Disp: 90 tablet, Rfl: 0 .  busPIRone (BUSPAR) 30 MG tablet, Take 1 tablet (30 mg total) by mouth 2 (two) times daily., Disp: 180 tablet, Rfl: 0 .  finasteride (PROSCAR) 5 MG tablet,  Take 5 mg by mouth., Disp: , Rfl:  .  tamsulosin (FLOMAX) 0.4 MG CAPS capsule, Take 0.4 mg by mouth., Disp: , Rfl:  .  VIIBRYD 40 MG TABS, TAKE 1 TABLET(40 MG) BY MOUTH DAILY AFTER SUPPER, Disp: 90 tablet, Rfl: 0 .  zolpidem (AMBIEN) 10 MG tablet, Take 1 tablet (10 mg total) by mouth at bedtime., Disp: 90 tablet, Rfl: 0 Medication Side Effects: none  Family Medical/ Social History: Changes? No  MENTAL HEALTH EXAM:  Height 6' (1.829 m), weight 215 lb (97.5 kg).Body mass index is 29.16 kg/m. Muscle strengths and tone 5/5, postural reflexes and gait 0/0, and AIMS = 0 others deferred for coronavirus shutdown  General Appearance: Casual, Fairly Groomed, Guarded and Meticulous  Eye Contact:  Fair  Speech:  Clear and Coherent, Normal Rate and Talkative  Volume:  Normal  Mood:  Anxious, Depressed, Dysphoric, Euthymic and Worthless  Affect:  Constricted, Depressed, Inappropriate, Labile and Anxious  Thought Process:  Coherent, Goal Directed, Irrelevant and Descriptions of Associations: Tangential  Orientation:  Full (Time, Place, and Person)  Thought Content: Ilusions, Rumination and Tangential   Suicidal Thoughts:  No  Homicidal Thoughts:  No  Memory:  Immediate;   Good Remote;   Good  Judgement:  Fair  Insight:  Fair  Psychomotor Activity:  Normal, Decreased and Mannerisms  Concentration:  Concentration: Fair and Attention Span: Fair  Recall:  [employer] of Knowledge: Good  Language: Good  Assets:  Leisure Time Resilience Talents/Skills  ADL's:  Intact  Cognition: WNL  Prognosis:  Fair    DIAGNOSES:    ICD-10-CM   1. Mild recurrent major depression (HCC)  F33.0 busPIRone (BUSPAR) 30 MG tablet    buPROPion (WELLBUTRIN XL) 300 MG 24 hr tablet  2. Generalized anxiety disorder  F41.1 busPIRone (BUSPAR) 30 MG tablet    buPROPion (WELLBUTRIN XL) 300 MG 24 hr tablet  3. Insomnia disorder, with non-sleep disorder mental comorbidity, persistent  F51.05 zolpidem (AMBIEN) 10 MG tablet   4. Avoidant-restrictive food intake disorder (ARFID)  F50.82     Receiving Psychotherapy: No    RECOMMENDATIONS: Psychosupportive psychoeducation integrates behavioral nutrition, sleep hygiene, social skills, and frustration management interventions with medication management for symptom treatment matching.  He still has sweating and fatigue with motivation needing increased Wellbutrin.  Wellbutrin is E scribed 300 mg daily after breakfast as a 90-day supply sent to [facility] on [address] in [facility] for depression, anxiety, and diaphoresis.  BuSpar is continued 30 mg twice daily sent as a 90-day supply to [facility] on [address] for generalized anxiety.  Ambien 10 mg nightly #90 with no refill is sent to [facility] on [address] for insomnia of depression and anxiety.  He continues current supply of Viibryd 40 mg daily after supper for anxiety, depression, and ARFID.  He has current supply of Xanax 1 mg 4 times daily as needed for anxiety.  He returns in 6 weeks or sooner if necessary for follow-up and labs should be available to him by then from PCP.  Delight Hoh, MD

## 2019-06-10 ENCOUNTER — Other Ambulatory Visit: Payer: Self-pay | Admitting: Psychiatry

## 2019-06-10 DIAGNOSIS — F330 Major depressive disorder, recurrent, mild: Secondary | ICD-10-CM

## 2019-06-10 DIAGNOSIS — F411 Generalized anxiety disorder: Secondary | ICD-10-CM

## 2019-07-08 ENCOUNTER — Other Ambulatory Visit: Payer: Self-pay

## 2019-07-08 ENCOUNTER — Ambulatory Visit (INDEPENDENT_AMBULATORY_CARE_PROVIDER_SITE_OTHER): Payer: 59 | Admitting: Psychiatry

## 2019-07-08 ENCOUNTER — Encounter: Payer: Self-pay | Admitting: Psychiatry

## 2019-07-08 VITALS — Ht 73.0 in | Wt 238.0 lb

## 2019-07-08 DIAGNOSIS — F411 Generalized anxiety disorder: Secondary | ICD-10-CM | POA: Diagnosis not present

## 2019-07-08 DIAGNOSIS — G2581 Restless legs syndrome: Secondary | ICD-10-CM

## 2019-07-08 DIAGNOSIS — F5105 Insomnia due to other mental disorder: Secondary | ICD-10-CM | POA: Diagnosis not present

## 2019-07-08 DIAGNOSIS — F330 Major depressive disorder, recurrent, mild: Secondary | ICD-10-CM | POA: Diagnosis not present

## 2019-07-08 DIAGNOSIS — F5081 Binge eating disorder: Secondary | ICD-10-CM | POA: Diagnosis not present

## 2019-07-08 MED ORDER — LISDEXAMFETAMINE DIMESYLATE 30 MG PO CAPS: 30.0000 mg | ORAL_CAPSULE | Freq: Every day | ORAL | 0 refills | Status: DC

## 2019-07-08 NOTE — Progress Notes (Signed)
[facility]  Patient ID: Mario Briggs,  MRN: [id number]  PCP: Mario Obey, MD  Date of Evaluation: 07/08/2019 Time spent:20 minutes from 1300 to 1320  Chief Complaint:  Chief Complaint    Anxiety; Altered Mental Status; Eating Disorder      HISTORY/CURRENT STATUS: Mario Briggs is seen onsite in office 20 minutes face-to-face conjointly with wife Mario Briggs with consent with epic collateral for psychiatric interview and exam in 6-week evaluation and management of generalized anxiety and now mild recurrent depression, transition of avoidant restrictive food intake disorder to binge eating disorder, and insomnia associated with mental comorbidity and restless legs.  Mood is much improved on increased Wellbutrin doubled last appointment to 300 mg XL daily, but he is interpersonally overwhelmed with weight gain of 23 pounds in the last month eating a lot of ice cream.  He additionally has a fracture of a left lower molar that has now abscessed in the last 2 days still avoiding care but exploring options today from initially seeking dental antibiotic through [facility] to then seeing the dentist of [place].  He notes fatigue and malaise likely associated with the abscess.  He discusses akathisia from [place] in the past as well as exaggerating binge eating in 2018.  We clarify and segregate current medications from such past negative experience.  He has no mania, suicidality, psychosis or delirium today.  Depression The patient presents withdepressionas a recurrentproblem with current episode starting 5 monthsago. The onset quality is sudden. The problem occurs daily.The episode has now been gradually improvingsince onset.Associated symptoms include fatigue,insomnia,disinterest, anxiety, and sad. Associated symptoms include no decreased concentration,no helplessness,no hopelessness,not irritable,no appetite change,no diaphoresis, no indigestion, no prepanic,and no suicidal  ideas.The symptoms are aggravated by work stress, social issues and medication withdrawal.Past treatments include SSRIs - Selective serotonin reuptake inhibitors, other medications, psychotherapy, SNRIs - Serotonin and norepinephrine reuptake inhibitors and TCAs - Tricyclic antidepressants.Compliance with treatment is variable and good.Past compliance problems include difficulty with treatment plan, medication issues and medical issues.Risk factors include family history, family history of mental illness, family violence, history of mental illness, history of self-injury, major life event, prior traumatic experience and stress. Past medical history includes anxiety,eating disorder,depressionand mental health disorder. Pertinent negatives include no recent illness,no life-threatening condition,no physical disability,no recent psychiatric admission,no bipolar disorder,no obsessive-compulsive disorder,no post-traumatic stress disorder,no schizophrenia,no suicide attemptsand no head trauma  Individual Medical History/ Review of Systems: Changes? :Yes Weight gain from 215 to 238 pounds feeling out-of-control also having left lower molar dental fracture and abscess.  Allergies: Patient has no known allergies.  Current Medications:  Current Outpatient Medications:  .  ALPRAZolam (XANAX) 1 MG tablet, TAKE 1 TABLET(1 MG) BY MOUTH FOUR TIMES DAILY AS NEEDED FOR ANXIETY, Disp: 360 tablet, Rfl: 0 .  buPROPion (WELLBUTRIN XL) 300 MG 24 hr tablet, Take 1 tablet (300 mg total) by mouth daily after breakfast., Disp: 90 tablet, Rfl: 0 .  busPIRone (BUSPAR) 30 MG tablet, TAKE 1 TABLET(30 MG) BY MOUTH TWICE DAILY, Disp: 180 tablet, Rfl: 0 .  finasteride (PROSCAR) 5 MG tablet, Take 5 mg by mouth., Disp: , Rfl:  .  lisdexamfetamine (VYVANSE) 30 MG capsule, Take 1 capsule (30 mg total) by mouth daily with breakfast., Disp: 90 capsule, Rfl: 0 .  tamsulosin (FLOMAX) 0.4 MG CAPS capsule, Take 0.4  mg by mouth., Disp: , Rfl:  .  VIIBRYD 40 MG TABS, TAKE 1 TABLET(40 MG) BY MOUTH DAILY AFTER SUPPER, Disp: 90 tablet, Rfl: 0 .  zolpidem (AMBIEN) 10 MG tablet,  Take 1 tablet (10 mg total) by mouth at bedtime., Disp: 90 tablet, Rfl: 0 Medication Side Effects: none  Family Medical/ Social History: Changes? No  MENTAL HEALTH EXAM:  Height 6\' 1"  (1.854 m), weight 238 lb (108 kg).Body mass index is 31.4 kg/m. Muscle strengths and tone 5/5, postural reflexes and gait 0/0, and AIMS = 0 otherwise deferred for coronavirus shutdown  General Appearance: Casual, Fairly Groomed, Guarded, Meticulous and Obese  Eye Contact:  Good  Speech:  Clear and Coherent, Normal Rate and Talkative  Volume:  Normal  Mood:  Anxious, Dysphoric, Euthymic and Worthless  Affect:  Congruent, Inappropriate, Full Range and Anxious  Thought Process:  Coherent, Goal Directed, Irrelevant and Descriptions of Associations: Circumstantial  Orientation:  Full (Time, Place, and Person)  Thought Content: Ilusions, Obsessions and Rumination   Suicidal Thoughts:  No  Homicidal Thoughts:  No  Memory:  Immediate;   Good Remote;   Good  Judgement:  Fair  Insight:  Fair  Psychomotor Activity:  Normal, Mannerisms and Restlessness  Concentration:  Concentration: Fair and Attention Span: Good  Recall:  Good  Fund of Knowledge: Good  Language: Good  Assets:  Desire for Improvement Leisure Time Resilience Talents/Skills and vocational achievement  ADL's:  Intact  Cognition: WNL  Prognosis:  Fair    DIAGNOSES:    ICD-10-CM   1. Mild recurrent major depression ([place])  F33.0   2. Generalized anxiety disorder  F41.1   3. Binge eating disorder  F50.81 lisdexamfetamine (VYVANSE) 30 MG capsule  4. Insomnia disorder, with non-sleep disorder mental comorbidity, persistent  F51.05   5. Restless legs syndrome  G25.81     Receiving Psychotherapy: No    RECOMMENDATIONS: We formulate and apply a plan to start a dental antibiotic  through [facility] and then to see wife's dentist for subsequent crown.  We address behavioral nutrition, sleep hygiene, and social skills and new work position at [employer].  Though wife verbally and cognitively undermines all his decisions, the patient can now work through next steps for hope and help to regain self-control such as for his eating disorder.  He continues has current supply of his 4 medications Wellbutrin 300 mg XL every morning for depression, generalized anxiety, and eating disorder now binge eating.  He continues to use BuSpar 30 mg twice daily for generalized anxiety and depression.  He continues Xanax 1 mg up to 4 times daily as needed for generalized anxiety and panic.  He continues Viibryd 40 mg daily after supper for depression, anxiety, and eating disorder.  We add Vyvanse 30 mg every morning #90 E scribed to [facility] on [address] [place] for binge eating disorder with no refill, being educated on warnings and risk of diagnoses and treatment including medication for prevention and monitoring, safety hygiene, and crisis plans if needed.  He returns in 5 weeks for follow up.   Delight Hoh, MD

## 2019-07-10 ENCOUNTER — Telehealth: Payer: Self-pay

## 2019-07-10 NOTE — Telephone Encounter (Signed)
Prior authorization submitted through cover my meds for Vyvanse 30 mg capsules 1/daily through [facility] approved effective 07/09/2019-07/08/2020

## 2019-08-04 ENCOUNTER — Other Ambulatory Visit: Payer: Self-pay | Admitting: Psychiatry

## 2019-08-04 DIAGNOSIS — F3341 Major depressive disorder, recurrent, in partial remission: Secondary | ICD-10-CM

## 2019-08-04 DIAGNOSIS — F5105 Insomnia due to other mental disorder: Secondary | ICD-10-CM

## 2019-08-04 DIAGNOSIS — F411 Generalized anxiety disorder: Secondary | ICD-10-CM

## 2019-08-04 DIAGNOSIS — F5082 Avoidant/restrictive food intake disorder: Secondary | ICD-10-CM

## 2019-08-05 NOTE — Telephone Encounter (Signed)
Having appointment next week, the patient needs 90-day supplies for his insurance on Viibryd and Xanax sent to [facility] on [address] as a 90-day supply each.

## 2019-08-05 NOTE — Telephone Encounter (Signed)
Has apt 08/12/2019 90 day supply request

## 2019-08-12 ENCOUNTER — Ambulatory Visit: Payer: 59 | Admitting: Psychiatry

## 2019-08-20 ENCOUNTER — Encounter: Payer: Self-pay | Admitting: Psychiatry

## 2019-08-20 ENCOUNTER — Ambulatory Visit (INDEPENDENT_AMBULATORY_CARE_PROVIDER_SITE_OTHER): Payer: 59 | Admitting: Psychiatry

## 2019-08-20 ENCOUNTER — Other Ambulatory Visit: Payer: Self-pay

## 2019-08-20 VITALS — Ht 73.0 in | Wt 242.0 lb

## 2019-08-20 DIAGNOSIS — F411 Generalized anxiety disorder: Secondary | ICD-10-CM | POA: Diagnosis not present

## 2019-08-20 DIAGNOSIS — F5105 Insomnia due to other mental disorder: Secondary | ICD-10-CM

## 2019-08-20 DIAGNOSIS — G2581 Restless legs syndrome: Secondary | ICD-10-CM

## 2019-08-20 DIAGNOSIS — F330 Major depressive disorder, recurrent, mild: Secondary | ICD-10-CM

## 2019-08-20 DIAGNOSIS — F5081 Binge eating disorder: Secondary | ICD-10-CM

## 2019-08-20 MED ORDER — LISDEXAMFETAMINE DIMESYLATE 50 MG PO CAPS: 50.0000 mg | ORAL_CAPSULE | Freq: Every day | ORAL | 0 refills | Status: DC

## 2019-08-20 MED ORDER — ZOLPIDEM TARTRATE 10 MG PO TABS: 10.0000 mg | ORAL_TABLET | Freq: Every day | ORAL | 1 refills | Status: DC

## 2019-08-20 MED ORDER — VIIBRYD 40 MG PO TABS: 40.0000 mg | ORAL_TABLET | Freq: Every day | ORAL | 5 refills | Status: DC

## 2019-08-20 MED ORDER — BUPROPION HCL ER (XL) 300 MG PO TB24: 300.0000 mg | ORAL_TABLET | Freq: Every day | ORAL | 1 refills | Status: DC

## 2019-08-20 NOTE — Progress Notes (Signed)
[facility]  Patient ID: Mario Briggs,  MRN: [id number]  PCP: Suzanna Obey, MD  Date of Evaluation: 08/20/2019 Time spent:20 minutes from 1320 to 1340  Chief Complaint:  Chief Complaint    Depression; Anxiety; Eating Disorder      HISTORY/CURRENT STATUS: Mario Briggs is seen onsite in office 20 minutes face-to-face conjointly with wife with consent with epic collateral for psychiatric interview and exam in 5-week evaluation and management of generalized anxiety, major depression, binge eating disorder, restless legs syndrome, and insomnia disorder.  Mario Briggs reviews interim successful dental work which however was protracted and complicated from last session but represents the first time he has been to the dentist in many years as a precedent he feels he can repeat when needed with another dentist but not the current one.  He notes that he had an extraction and root canal done in sequence that sustained the pain despite antibiotic and analgesic.  He does have a sense of accomplishment.  He is making a list in the lobby on his cell phone of projects he needs to complete at home representing improved motivation and participation in daily life.  Similarly he is applying himself to his new job description at work successfully and with satisfaction.  He does not experience joy in the day or year so far but does have an improved mood over last appointment.  The Vyvanse 30 mg in the last month which has required prior authorization in the interim has helped motivation but not binge eating yet, though having no adverse effects but gaining 4 pounds in the interim 5 weeks.  The new year also brings his insurance deductible needing to change his Viibryd savings plan from every 3 months to monthly.  He does state that he had much hope for 2021 but that his teeth have dashed that somewhat but even more he processes grief and loss today for the death of wife of his stepfather who has been his main father  figure in life actually before worse from patient's mother.  Patient processes for the first time birth father having nothing to do with him and continuing to refuse such, though patient hopes for reunion someday..  2 medications were E scribed in the interim and remainder are updated today.  He does not conclude the willingness to start psychotherapy today.  He has no mania, suicidality, psychosis or delirium.  Depression The patient presents withdepressionas arecurrentproblem with current episode starting 6 monthsago. The onset quality is sudden. The problem occursdaily.The episode has now been gradually improvingsince onset.Associated symptoms include insomnia,disinterest, anxiety,and sad. Associated symptoms include no decreased concentration,no fatigue,no helplessness,no hopelessness,not irritable,no appetite change,no diaphoresis, no indigestion, no prepanic,and no suicidal ideas.The symptoms are aggravated by work stress, social issues and medication withdrawal.Past treatments include SSRIs - Selective serotonin reuptake inhibitors, other medications, psychotherapy, SNRIs - Serotonin and norepinephrine reuptake inhibitors and TCAs - Tricyclic antidepressants.Compliance with treatment is variable and good.Past compliance problems include difficulty with treatment plan, medication issues and medical issues.Risk factors include family history, family history of mental illness, family violence, history of mental illness, history of self-injury, major life event, prior traumatic experience and stress. Past medical history includes anxiety,eating disorder,depressionand mental health disorder. Pertinent negatives include no recent illness,no life-threatening condition,no physical disability,no recent psychiatric admission,no bipolar disorder,no obsessive-compulsive disorder,no post-traumatic stress disorder,no schizophrenia,no suicide attemptsand no  head trauma  Individual Medical History/ Review of Systems: Changes? :Yes Weight up 4 pounds despite the 30 mg Vyvanse and dental extraction and root canal were  completed.  Allergies: Patient has no known allergies.  Current Medications:  Current Outpatient Medications:  .  ALPRAZolam (XANAX) 1 MG tablet, TAKE 1 TABLET(1 MG) BY MOUTH FOUR TIMES DAILY AS NEEDED FOR ANXIETY, Disp: 360 tablet, Rfl: 0 .  buPROPion (WELLBUTRIN XL) 300 MG 24 hr tablet, Take 1 tablet (300 mg total) by mouth daily after breakfast., Disp: 90 tablet, Rfl: 1 .  busPIRone (BUSPAR) 30 MG tablet, TAKE 1 TABLET(30 MG) BY MOUTH TWICE DAILY, Disp: 180 tablet, Rfl: 0 .  finasteride (PROSCAR) 5 MG tablet, Take 5 mg by mouth., Disp: , Rfl:  .  lisdexamfetamine (VYVANSE) 50 MG capsule, Take 1 capsule (50 mg total) by mouth daily with breakfast., Disp: 30 capsule, Rfl: 0 .  [START ON 09/19/2019] lisdexamfetamine (VYVANSE) 50 MG capsule, Take 1 capsule (50 mg total) by mouth daily after breakfast., Disp: 30 capsule, Rfl: 0 .  [START ON 10/19/2019] lisdexamfetamine (VYVANSE) 50 MG capsule, Take 1 capsule (50 mg total) by mouth daily after breakfast., Disp: 30 capsule, Rfl: 0 .  tamsulosin (FLOMAX) 0.4 MG CAPS capsule, Take 0.4 mg by mouth., Disp: , Rfl:  .  Vilazodone HCl (VIIBRYD) 40 MG TABS, Take 1 tablet (40 mg total) by mouth daily after supper., Disp: 30 tablet, Rfl: 5 .  zolpidem (AMBIEN) 10 MG tablet, Take 1 tablet (10 mg total) by mouth at bedtime., Disp: 90 tablet, Rfl: 1  Medication Side Effects: none except cost  Family Medical/ Social History: Changes? Yes death of wife of previous stepfather to both of whom patient was close likely more than his own mother and certainly more than his birth father.  MENTAL HEALTH EXAM:  Height 6\' 1"  (1.854 m), weight 242 lb (109.8 kg).Body mass index is 31.93 kg/m. Muscle strengths and tone 5/5, postural reflexes and gait 0/0, and AIMS = 0 otherwise deferred for coronavirus shutdown   General Appearance: Casual, Fairly Groomed, Meticulous and Obese  Eye Contact:  Good  Speech:  Clear and Coherent, Normal Rate and Talkative  Volume:  Normal  Mood:  Anxious, Depressed, Dysphoric, Euthymic and Worthless  Affect:  Appropriate, Congruent, Depressed, Full Range and Anxious  Thought Process:  Coherent, Goal Directed, Irrelevant, Linear and Descriptions of Associations: Tangential with improved motivation  Orientation:  Full (Time, Place, and Person)  Thought Content: Ilusions, Rumination and Tangential   Suicidal Thoughts:  No  Homicidal Thoughts:  No  Memory:  Immediate;   Good Remote;   Good  Judgement:  Fair  Insight:  Fair  Psychomotor Activity:  Normal, Increased and Mannerisms  Concentration:  Concentration: Good and Attention Span: Good  Recall:  Good  Fund of Knowledge: Good  Language: Good  Assets:  Desire for Improvement Leisure Time Resilience Talents/Skills  ADL's:  Intact  Cognition: WNL  Prognosis:  Fair to good    DIAGNOSES:    ICD-10-CM   1. Mild recurrent major depression (HCC)  F33.0 buPROPion (WELLBUTRIN XL) 300 MG 24 hr tablet    lisdexamfetamine (VYVANSE) 50 MG capsule    lisdexamfetamine (VYVANSE) 50 MG capsule    lisdexamfetamine (VYVANSE) 50 MG capsule    Vilazodone HCl (VIIBRYD) 40 MG TABS  2. Generalized anxiety disorder  F41.1 buPROPion (WELLBUTRIN XL) 300 MG 24 hr tablet    Vilazodone HCl (VIIBRYD) 40 MG TABS  3. Binge eating disorder  F50.81 lisdexamfetamine (VYVANSE) 50 MG capsule    lisdexamfetamine (VYVANSE) 50 MG capsule    lisdexamfetamine (VYVANSE) 50 MG capsule  4. Insomnia disorder, with non-sleep disorder  mental comorbidity, persistent  F51.05 zolpidem (AMBIEN) 10 MG tablet  5. Restless legs syndrome  G25.81     Receiving Psychotherapy: No    RECOMMENDATIONS: Grief and loss and cognitive restructuring of next steps after achieving motivation and interest for binge eating, mood, and employment are all addressed.  He  accepts psychosupportive psychoeducation for prevention and monitoring and safety hygiene.  He is E scribed an increased dose of Vyvanse 50 mg for morning sent as #30 each for January 19, February 18, and March 20 for binge eating disorder and depression to [facility] in [place].  Viibryd 40 mg every evening meal is E scribed to revise from the 90-day supply to #30 with 5 refills to [facility] for depression, anxiety, and binge eating disorder with updated savings copay coupon provided.  Xanax is updated 1 mg 4 times daily as needed anxiety sent as #360 for 90-day supply with no refill to [facility] for generalized anxiety.  Ambien 10 mg every bedtime #90 with 1 refill is sent to [facility] for restless legs and insomnia of nonsleep disorder comorbidity.  Wellbutrin 300 mg XL every morning #90 with 1 refill is sent to [facility] for anxiety, depression and binge eating disorder.  BuSpar 30 mg twice daily for generalized anxiety and depression is slightly provided for current supply.  He returns for follow-up in 3 months   Delight Hoh, MD

## 2019-09-02 ENCOUNTER — Other Ambulatory Visit: Payer: Self-pay | Admitting: Psychiatry

## 2019-09-02 DIAGNOSIS — F411 Generalized anxiety disorder: Secondary | ICD-10-CM

## 2019-09-02 DIAGNOSIS — F330 Major depressive disorder, recurrent, mild: Secondary | ICD-10-CM

## 2019-09-11 ENCOUNTER — Other Ambulatory Visit: Payer: Self-pay | Admitting: Psychiatry

## 2019-09-11 DIAGNOSIS — F411 Generalized anxiety disorder: Secondary | ICD-10-CM

## 2019-09-11 DIAGNOSIS — F330 Major depressive disorder, recurrent, mild: Secondary | ICD-10-CM

## 2019-11-04 ENCOUNTER — Other Ambulatory Visit: Payer: Self-pay | Admitting: Psychiatry

## 2019-11-04 DIAGNOSIS — F411 Generalized anxiety disorder: Secondary | ICD-10-CM

## 2019-11-04 DIAGNOSIS — F5105 Insomnia due to other mental disorder: Secondary | ICD-10-CM

## 2019-11-04 NOTE — Telephone Encounter (Signed)
Apt 11/18/2019

## 2019-11-18 ENCOUNTER — Other Ambulatory Visit: Payer: Self-pay

## 2019-11-18 ENCOUNTER — Encounter: Payer: Self-pay | Admitting: Psychiatry

## 2019-11-18 ENCOUNTER — Ambulatory Visit (INDEPENDENT_AMBULATORY_CARE_PROVIDER_SITE_OTHER): Payer: 59 | Admitting: Psychiatry

## 2019-11-18 VITALS — Ht 73.0 in | Wt 207.0 lb

## 2019-11-18 DIAGNOSIS — F330 Major depressive disorder, recurrent, mild: Secondary | ICD-10-CM | POA: Diagnosis not present

## 2019-11-18 DIAGNOSIS — G2581 Restless legs syndrome: Secondary | ICD-10-CM | POA: Diagnosis not present

## 2019-11-18 DIAGNOSIS — F5105 Insomnia due to other mental disorder: Secondary | ICD-10-CM

## 2019-11-18 DIAGNOSIS — F5081 Binge eating disorder: Secondary | ICD-10-CM | POA: Diagnosis not present

## 2019-11-18 DIAGNOSIS — F411 Generalized anxiety disorder: Secondary | ICD-10-CM

## 2019-11-18 DIAGNOSIS — F50819 Binge eating disorder, unspecified: Secondary | ICD-10-CM

## 2019-11-18 MED ORDER — ALPRAZOLAM 1 MG PO TABS: ORAL_TABLET | ORAL | 0 refills | Status: DC

## 2019-11-18 MED ORDER — LISDEXAMFETAMINE DIMESYLATE 50 MG PO CAPS: 50.0000 mg | ORAL_CAPSULE | Freq: Every day | ORAL | 0 refills | Status: DC

## 2019-11-18 NOTE — Progress Notes (Signed)
[facility]  Patient ID: Mario Briggs,  MRN: [id number]  PCP: Mario Obey, MD  Date of Evaluation: 11/18/2019 Time spent:25 minutes from 1300 to 1325  Chief Complaint:  Chief Complaint    Anxiety; Depression; Eating Disorder      HISTORY/CURRENT STATUS: Mario Briggs is seen conjointly with wife Mario Briggs 25 minutes face-to-face with consent with epic collateral for psychiatric interview and exam in 17-month evaluation and management of major depression and generalized anxiety comorbid with binge eating and sleep disorders.  Patient has lost another 35 pounds in the interim 3 months stating he loves ice cream and has stopped all such weight gain elements as well as becoming more active.  Wife complains that he is always busy picking up lent from the floor so that she prefers that he stop the Vyvanse.  However the patient can control his appetite when on the Vyvanse and is also getting his work and projects done better, though he does feel somewhat desperate on the job that he is getting performance scores that are lower than his usual and worries him for job loss.  The sales expectations remain ceased from before last appointment last appointment, but they have new paradigms again in his work description.  He is distressed by COVID despite vaccines.  He prefers to continue the Vyvanse 50 mg when wife would prefer for him to reduce back to the 30 mg or none at all as she would prefer him to sit still like herself rather than keeping busy around house and property.  He otherwise continues Viibryd, Xanax, Ambien, BuSpar, and Wellbutrin.  The only physical discomfort is the left subscapular back pain he thinks from being so active moving furniture but has stopped riding his exercise bike until he gets relief.  His target weight is 200 pounds and he is now 7 pounds up from that having lost 35 pounds.  Mario Briggs registry documents last Vyvanse dispensing 11/05/2019 for a 30-day supply and last Xanax was  11/04/2019 for a 90-day supply.  He has no mania, suicidality, psychosis or delirium.   Depression The patient presents withdepressionas arecurrentproblemwithcurrent episode starting 62monthsago. The onset quality is sudden. The problem occursevery few days.[name] nowbeen gradually improvingsince onset.Associated symptoms include insomnia,weight loss, disinterest, anxiety,and reactively sad. Associated symptoms include no decreased concentration,no fatigue,no helplessness,no hopelessness,not irritable,no appetite change,nodiaphoresis,no indigestion, no prepanic,and no suicidal ideas.The symptoms are aggravated by work stress, social issues and medication withdrawal.Past treatments include SSRIs - Selective serotonin reuptake inhibitors, other medications, psychotherapy, SNRIs - Serotonin and norepinephrine reuptake inhibitors and TCAs - Tricyclic antidepressants.Compliance with treatment is variable and good.Past compliance problems include difficulty with treatment plan, medication issues and medical issues.Risk factors include family history, family history of mental illness, family violence, history of mental illness, history of self-injury, major life event, prior traumatic experience and stress. Past medical history includes anxiety,eating disorder,depressionand mental health disorder. Pertinent negatives include no recent illness,no life-threatening condition,no physical disability,no recent psychiatric admission,no bipolar disorder,no obsessive-compulsive disorder,no post-traumatic stress disorder,no schizophrenia,no suicide attemptsand no head trauma  Individual Medical History/ Review of Systems: Changes? :Yes 35 pound weight reduction on the Vyvanse 50 mg daily controlling his binge eating and being much more appropriately physically active which disturbs wife who would for him to be sedentary.  Allergies: Patient has no  known allergies.  Current Medications:  Current Outpatient Medications:  .  ALPRAZolam (XANAX) 1 MG tablet, TAKE 1 TABLET(1 MG) BY MOUTH FOUR TIMES DAILY AS NEEDED FOR ANXIETY, Disp: 360 tablet, Rfl: 0 .  buPROPion (WELLBUTRIN XL) 300 MG 24 hr tablet, Take 1 tablet (300 mg total) by mouth daily after breakfast., Disp: 90 tablet, Rfl: 1 .  busPIRone (BUSPAR) 30 MG tablet, TAKE 1 TABLET(30 MG) BY MOUTH TWICE DAILY, Disp: 180 tablet, Rfl: 0 .  finasteride (PROSCAR) 5 MG tablet, Take 5 mg by mouth., Disp: , Rfl:  .  [START ON 12/05/2019] lisdexamfetamine (VYVANSE) 50 MG capsule, Take 1 capsule (50 mg total) by mouth daily with breakfast., Disp: 30 capsule, Rfl: 0 .  lisdexamfetamine (VYVANSE) 50 MG capsule, Take 1 capsule (50 mg total) by mouth daily after breakfast., Disp: 30 capsule, Rfl: 0 .  [START ON 02/03/2020] lisdexamfetamine (VYVANSE) 50 MG capsule, Take 1 capsule (50 mg total) by mouth daily after breakfast., Disp: 30 capsule, Rfl: 0 .  tamsulosin (FLOMAX) 0.4 MG CAPS capsule, Take 0.4 mg by mouth., Disp: , Rfl:  .  Vilazodone HCl (VIIBRYD) 40 MG TABS, Take 1 tablet (40 mg total) by mouth daily after supper., Disp: 30 tablet, Rfl: 5 .  zolpidem (AMBIEN) 10 MG tablet, Take 1 tablet (10 mg total) by mouth at bedtime., Disp: 90 tablet, Rfl: 1  Medication Side Effects: Weight reduction mostly from ARFID  Family Medical/ Social History: Changes? No  MENTAL HEALTH EXAM:  Height 6\' 1"  (1.854 m), weight 207 lb (93.9 kg).Body mass index is 27.31 kg/m. Muscle strengths and tone 5/5, postural reflexes and gait 0/0, and AIMS = 0 otherwise deferred for coronavirus shutdown  General Appearance: Casual, Fairly Groomed, Guarded and Obese  Eye Contact:  Fair  Speech:  Clear and Coherent, Normal Rate and Talkative  Volume:  Normal  Mood:  Anxious, Dysphoric, Euphoric, Euthymic and Irritable  Affect:  Depressed, Inappropriate, Full Range and Anxious  Thought Process:  Coherent, Goal Directed, Irrelevant,  Linear and Descriptions of Associations: Tangential  Orientation:  Full (Time, Place, and Person)  Thought Content: Logical, Ilusions, Obsessions, Paranoid Ideation, Rumination and Tangential   Suicidal Thoughts:  No  Homicidal Thoughts:  No  Memory:  Immediate;   Fair Remote;   Fair  Judgement:  Fair  Insight:  Fair and Lacking  Psychomotor Activity:  Normal, Increased, Mannerisms and Restlessness  Concentration:  Concentration: Fair and Attention Span: Fair  Recall:  Good  Fund of Knowledge: Good  Language: Good  Assets:  Leisure Time Physical Health Resilience Transportation  ADL's:  Intact  Cognition: WNL  Prognosis:  Fair    DIAGNOSES:    ICD-10-CM   1. Mild recurrent major depression (HCC)  F33.0 lisdexamfetamine (VYVANSE) 50 MG capsule    lisdexamfetamine (VYVANSE) 50 MG capsule    lisdexamfetamine (VYVANSE) 50 MG capsule  2. Generalized anxiety disorder  F41.1 ALPRAZolam (XANAX) 1 MG tablet  3. Binge eating disorder  F50.81 lisdexamfetamine (VYVANSE) 50 MG capsule    lisdexamfetamine (VYVANSE) 50 MG capsule    lisdexamfetamine (VYVANSE) 50 MG capsule  4. Restless legs syndrome  G25.81   5. Insomnia disorder, with non-sleep disorder mental comorbidity, persistent  F51.05 ALPRAZolam (XANAX) 1 MG tablet    Receiving Psychotherapy: No    RECOMMENDATIONS: Periodically, the patient can perceive and quantify the amount of despair and anxiety he acquires environmentally and in relationships with which he attempts to cope even if keeping busy with small jobs around the house. He finds the Vyvanse helpful in many ways while wife would prefer a lower dose or not at all. We addressed the appropriate use of the Vyvanse and formulate with his goal weight weight maintenance and  house hygiene are good for his activities being able to rest other times. We review hope and help for his work rather than his dysthymic tendency to give up again. We also reviewed ways to preserve modest  dessert after meals rather than totally denying himself ice cream. We process gentle range of motion becoming isometric and stretching for his subscapular strain on the left. He is E scribed Vyvanse 50 mg every morning after breakfast sent as #30 each for May 6, June 5, and July 5 to [facility] in [place] for binge eating disorder and depression to phone  to reduce to 30 mg in place of the 50 mg in the interim if needed especially for excessive weight loss.. Having recent refill for 3 months for Xanax, we send 7-month supply to be on file with the pharmacy for Xanax 1 mg 4 times daily #360 with no refill to [facility] for anxiety and binge eating disorder. He has current supply of Wellbutrin 300 mg XL taking 1 tablet every morning with a 90-day supply remaining for binge eating disorder, anxiety and depression. He has a 90-day refill left on Ambien 10 mg nightly for insomnia and restless leg. He has remaining supply of BuSpar 30 mg twice daily likely to cover until next appointment for anxiety. He returns for follow-up in 3 months or sooner if needed.   Delight Hoh, MD

## 2019-11-27 NOTE — Telephone Encounter (Signed)
error 

## 2019-12-03 ENCOUNTER — Other Ambulatory Visit: Payer: Self-pay | Admitting: Psychiatry

## 2019-12-03 DIAGNOSIS — F330 Major depressive disorder, recurrent, mild: Secondary | ICD-10-CM

## 2019-12-03 DIAGNOSIS — F411 Generalized anxiety disorder: Secondary | ICD-10-CM

## 2020-01-23 ENCOUNTER — Ambulatory Visit (INDEPENDENT_AMBULATORY_CARE_PROVIDER_SITE_OTHER): Payer: 59 | Admitting: Psychiatry

## 2020-01-23 ENCOUNTER — Encounter: Payer: Self-pay | Admitting: Psychiatry

## 2020-01-23 ENCOUNTER — Other Ambulatory Visit: Payer: Self-pay

## 2020-01-23 VITALS — Ht 73.0 in | Wt 224.0 lb

## 2020-01-23 DIAGNOSIS — G2581 Restless legs syndrome: Secondary | ICD-10-CM

## 2020-01-23 DIAGNOSIS — F330 Major depressive disorder, recurrent, mild: Secondary | ICD-10-CM

## 2020-01-23 DIAGNOSIS — F411 Generalized anxiety disorder: Secondary | ICD-10-CM | POA: Diagnosis not present

## 2020-01-23 DIAGNOSIS — F5081 Binge eating disorder: Secondary | ICD-10-CM | POA: Diagnosis not present

## 2020-01-23 DIAGNOSIS — F428 Other obsessive-compulsive disorder: Secondary | ICD-10-CM | POA: Insufficient documentation

## 2020-01-23 DIAGNOSIS — F5105 Insomnia due to other mental disorder: Secondary | ICD-10-CM

## 2020-01-23 NOTE — Progress Notes (Signed)
[facility]  Patient ID: Mario Briggs,  MRN: [id number]  PCP: Suzanna Obey, MD  Date of Evaluation: 01/23/2020 Time spent:25 minutes from 1110 to 4  Chief Complaint:  Chief Complaint    Anxiety; Depression; Eating Disorder; Stress      HISTORY/CURRENT STATUS: Mario Briggs is seen onsite in office 25 minutes face-to-face conjointly with wife Mario Briggs with epic collateral and consent for psychiatric interview and exam in 52-month evaluation and management of major depression, generalized anxiety, binge eating disorder, and restless legs and insomnia associated with anxiety.  Patient starts vacation this weekend but is overwhelmed with grieving for the death of his cats that had to be put down.  Patient has tears for the cats is comforted by wife.  The patient has existential crisis asking why he is here, stating that his job is still a struggle after 27 years and he has a new leader at work which is often more difficult than helpful. Patient states his performance measures at work are just not as good by the numbers.  He fears job loss at some point. Wife attributes his compulsive behaviors and obsessional fixations to Vyvanse, though the other of his medications also specifically of concern  might be Wellbutrin.  Patient has stopped Vyvanse acutely after [place] registry documents last dispensing 01/05/2020 having the last Ambien dispensing 11/19/2013 and last Xanax on 11/04/2019.  Wife complains that he still picks up lint from the carpet compulsively among other habitual needs for reassurance and rechecking, all just too much.  The patient is most concerned currently in care about his weight, and he has gained 17 in the last 2 months not likely just related to the stopping of Vyvanse.  He is likely overall more anxious taking less medication in general, though hopefully taking his Viibryd and BuSpar.  We review the targets for all of his medications in the chronological course of treatment since  March 2006.  He had Zoloft, Celexa, Prozac, Effexor, Trintellix, Viibryd, Lexapro, Remeron, Anafranil, and augmentation by Klonopin, Ativan, Tranxene, Xanax, BuSpar, Zyprexa, Rexulti, and Latuda.  Sleep medications have also included Ambien and Deseryl. Other eating disorder medications have included Wellbutriin, Topamax, and Revia.  Patient now considers his therapy at time of missing work for panic attacks to have been less successful at [place] than suspected clinically here at the time.  He is not manic, psychotic, delirious, or suicidal.  However wife emphasizes that he is not more depressed but just grieving, and patient emphasizes that he is more anxious in generalized and obsessive compulsive fashion. Wife considers his impulsive and obsessional symptoms to simply be from his Vyvanse which might also implicate Wellbutrin.  Depression The patient presents withdepressionas arecurrentproblemwithcurrent episode starting11monthsago. The onset quality is sudden. The problem occursdaily.Theepisodehas nowbeen rapidly worsening since the death of his cats.Associated symptoms include insomnia,weight change, disinterest, existential crisis, anxiety,and reactively sad. Associated symptoms include no decreased concentration,no fatigue,nohelplessness,no hopelessness,not irritable,no appetite change,nodiaphoresis,no indigestion, no prepanic,and no suicidal ideas.The symptoms are aggravated by work stress, social issues and medication withdrawal.Past treatments include SSRIs - Selective serotonin reuptake inhibitors, other medications, psychotherapy, SNRIs - Serotonin and norepinephrine reuptake inhibitors and TCAs - Tricyclic antidepressants.Compliance with treatment is variable and good.Past compliance problems include difficulty with treatment plan, medication issues and medical issues.Risk factors include family history, family history of mental illness,  family violence, history of mental illness, history of self-injury, major life event, prior traumatic experience and stress. Past medical history includes anxiety,eating disorder,depressionand mental health disorder. Pertinent negatives  include no recent illness,no life-threatening condition,no physical disability,no recent psychiatric admission,no bipolar disorder,no post-traumatic stress disorder,no schizophrenia,no suicide attemptsand no head trauma  Individual Medical History/ Review of Systems: Changes? :Yes Weight is back up 17 pounds in the last 2 months patient's goal weight being 200 pounds.  He is stressed that he completed an office demographic for [facility] physicians seeking to schedule a general medical exam and the word obesity had to be endorsed.  Allergies: Patient has no known allergies.  Current Medications:  Current Outpatient Medications:  .  ALPRAZolam (XANAX) 1 MG tablet, TAKE 1 TABLET(1 MG) BY MOUTH FOUR TIMES DAILY AS NEEDED FOR ANXIETY, Disp: 360 tablet, Rfl: 0 .  busPIRone (BUSPAR) 30 MG tablet, TAKE 1 TABLET(30 MG) BY MOUTH TWICE DAILY, Disp: 180 tablet, Rfl: 0 .  finasteride (PROSCAR) 5 MG tablet, Take 5 mg by mouth., Disp: , Rfl:  .  tamsulosin (FLOMAX) 0.4 MG CAPS capsule, Take 0.4 mg by mouth., Disp: , Rfl:  .  Vilazodone HCl (VIIBRYD) 40 MG TABS, Take 1 tablet (40 mg total) by mouth daily after supper., Disp: 30 tablet, Rfl: 5 .  zolpidem (AMBIEN) 10 MG tablet, Take 1 tablet (10 mg total) by mouth at bedtime., Disp: 90 tablet, Rfl: 1  Medication Side Effects: anxiety  Family Medical/ Social History: Changes? No though mother and brother have been significant sources of despair and anxiety  MENTAL HEALTH.:  Height 6\' 1"  (1.854 m), weight 224 lb (101.6 kg).Body mass index is 29.55 kg/m. Muscle strengths and tone 5/5, postural reflexes and gait 0/0, and AIMS = 0.  General Appearance: Casual, Fairly Groomed, Guarded and Meticulous  Eye Contact:  Fair   Speech:  Clear and Coherent, Normal Rate and Talkative  Volume:  Normal  Mood:  Anxious, Depressed, Dysphoric and Worthless  Affect:  Congruent, Depressed, Inappropriate, Restricted, Tearful and Anxious  Thought Process:  Coherent, Goal Directed, Irrelevant and Descriptions of Associations: Circumstantial  Orientation:  Full (Time, Place, and Person)  Thought Content: Ilusions, Obsessions and Rumination   Suicidal Thoughts:  No  Homicidal Thoughts:  No  Memory:  Immediate;   Fair Remote;   Fair  Judgement:  Fair  Insight:  Fair  Psychomotor Activity:  Normal, Increased, Decreased and Mannerisms  Concentration:  Concentration: Fair and Attention Span: Fair  Recall:  [employer] of Knowledge: Good  Language: Good  Assets:  Desire for Improvement Leisure Time Resilience Talents/Skills  ADL's:  Intact  Cognition: WNL  Prognosis:  Fair    DIAGNOSES:    ICD-10-CM   1. Mild recurrent major depression ([place])  F33.0   2. Generalized anxiety disorder  F41.1   3. Other obsessive-compulsive disorder  F42.8   4. Binge eating disorder  F50.81   5. Restless legs syndrome  G25.81   6. Insomnia disorder, with non-sleep disorder mental comorbidity, persistent  F51.05     Receiving Psychotherapy: No    RECOMMENDATIONS: [facility] accepts only the reference of [employer], LCSW as possible resource for psychotherapy.  However he and wife may not agree upon that unless stopping the Wellbutrin in addition to the already stopping the Vyvanse does not resolve the other OCD symptoms than just the binge eating disorder.  Abrupt discontinuation of the Wellbutrin will be helpful for distinguishing whether there is an improvement in his compulsive rituals and obsessional fixations with the only discontinuation symptoms being somnolence and fatigue for a few days, though they will monitor mood for any rebound depression.  He will continue  the Viibryd 40 mg daily after supper, BuSpar 20 mg twice daily, and Xanax 1  mg 4 times daily as needed for anxiety. If OCD symptoms persist status as more primary target for treatment, changing Viibryd to Luvox or Cymbalta, or augmenting Viibryd with Arman Filter can be considered.  Therapy options also include hospice grief therapy for death of his first wife and [facility] mental health relative to his job family of origin.  He returns in 4 weeks having appointment from last visit here.   Delight Hoh, MD

## 2020-02-12 ENCOUNTER — Other Ambulatory Visit: Payer: Self-pay | Admitting: Psychiatry

## 2020-02-12 DIAGNOSIS — F330 Major depressive disorder, recurrent, mild: Secondary | ICD-10-CM

## 2020-02-12 DIAGNOSIS — F411 Generalized anxiety disorder: Secondary | ICD-10-CM

## 2020-02-17 ENCOUNTER — Ambulatory Visit: Payer: 59 | Admitting: Psychiatry

## 2020-03-02 ENCOUNTER — Other Ambulatory Visit: Payer: Self-pay | Admitting: Psychiatry

## 2020-03-02 DIAGNOSIS — F330 Major depressive disorder, recurrent, mild: Secondary | ICD-10-CM

## 2020-03-02 DIAGNOSIS — F411 Generalized anxiety disorder: Secondary | ICD-10-CM

## 2020-03-09 ENCOUNTER — Telehealth: Payer: Self-pay | Admitting: Psychiatry

## 2020-03-09 ENCOUNTER — Other Ambulatory Visit: Payer: Self-pay

## 2020-03-09 DIAGNOSIS — F411 Generalized anxiety disorder: Secondary | ICD-10-CM

## 2020-03-09 DIAGNOSIS — F330 Major depressive disorder, recurrent, mild: Secondary | ICD-10-CM

## 2020-03-09 MED ORDER — VIIBRYD 40 MG PO TABS: ORAL_TABLET | ORAL | 0 refills | Status: DC

## 2020-03-09 NOTE — Telephone Encounter (Signed)
90 day supply of Viibryd sent to [facility] Patient has upcoming apt with Dr. Creig Hines on 03/19/2020

## 2020-03-09 NOTE — Telephone Encounter (Signed)
Please change the Viibyrd RX in August from 30 day to 90 day RX. To same [facility] on file. [facility] will fill a 90 day only.

## 2020-03-18 ENCOUNTER — Other Ambulatory Visit: Payer: Self-pay

## 2020-03-18 ENCOUNTER — Ambulatory Visit (INDEPENDENT_AMBULATORY_CARE_PROVIDER_SITE_OTHER): Payer: 59 | Admitting: Psychiatry

## 2020-03-18 ENCOUNTER — Encounter: Payer: Self-pay | Admitting: Psychiatry

## 2020-03-18 VITALS — Ht 73.0 in | Wt 268.0 lb

## 2020-03-18 DIAGNOSIS — F428 Other obsessive-compulsive disorder: Secondary | ICD-10-CM | POA: Diagnosis not present

## 2020-03-18 DIAGNOSIS — F5105 Insomnia due to other mental disorder: Secondary | ICD-10-CM

## 2020-03-18 DIAGNOSIS — F331 Major depressive disorder, recurrent, moderate: Secondary | ICD-10-CM | POA: Diagnosis not present

## 2020-03-18 DIAGNOSIS — F5081 Binge eating disorder: Secondary | ICD-10-CM | POA: Diagnosis not present

## 2020-03-18 DIAGNOSIS — G2581 Restless legs syndrome: Secondary | ICD-10-CM

## 2020-03-18 DIAGNOSIS — F411 Generalized anxiety disorder: Secondary | ICD-10-CM | POA: Diagnosis not present

## 2020-03-18 MED ORDER — CARIPRAZINE HCL 1.5 MG PO CAPS: 1.5000 mg | ORAL_CAPSULE | Freq: Every day | ORAL | 0 refills | Status: DC

## 2020-03-18 NOTE — Progress Notes (Signed)
[facility]  Patient ID: Fredrich Cory,  MRN: [id number]  PCP: Suzanna Obey, MD  Date of Evaluation: 03/18/2020 Time spent:25 minutes form 1325 to 1350  Chief Complaint:  Chief Complaint    Anxiety; Depression; Eating Disorder; Stress      HISTORY/CURRENT STATUS: Buell is seen onsite in office 25 minutes face-to-face conjointly with [name] with consent with epic collateral for psychiatric interview and exam in 7-week evaluation and management being 3 weeks overdue for anxiety and depression with OCD comorbidity including binge eating disorder. He discontinued Vyvanse and then Wellbutrin last appointment as wife suspected the medications were causing anxiety. Vyvanse had been positive for control of binge eating as well as his executive function and mood, though at times losing too much even from his own determined behavioral responses.  Wellbutrin of nearly 10 months titrated from 150 to 300 mg XL had some benefits but may have also increased anxiety according to the patient.   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   Patient has gained 17 pounds at last visit from the preceding appointment and now has gained 44 pounds additionally.  He has added ice cream each night before bed stating he is then feeling guilty for 23 hours after that bedtime binge.  However, this relapse has not relieved anxiety or depression which in fact are worse today than last appointment, though he does not necessarily acknowledge that.  He has significant existential despair still wondering why he is here thinking that there is no purpose to his life.  He is sad over mother demanding $5000 from him as she was responding to a scam from [employer] demanding money in promise for  relationships.  Patient cries as he describes his cat dying in his arms around the time of last appointment and has not replaced the cat as he cannot get over the preceding to bond with a new one. The medication chronological course of treatment since March 2006 includes Zoloft, Celexa, Prozac, Effexor, Trintellix, Viibryd, Lexapro, Remeron, and Anafranil with augmentation by Klonopin, Ativan, Tranxene, Xanax, BuSpar, Zyprexa, Rexulti, and Latuda and sleep medications Ambien and Deseryl with eating disorder medications Wellbutriin, Topamax, and Revia.  He has no mania, suicidality, psychosis, or delirium.  Depression The patient presents withdepressionas arecurrentproblemwithcurrent episode starting 15monthsago. The onset quality is sudden. The problem occursdaily.Theepisodehas nowbeen rapidly worsening since the death of his cats.Associated symptoms include insomnia,weight change,disinterest, existential crisis, passive,nihilistic wish, binge eating, excessive worry,helplessness,hopelessness,andreactivelysad. Associated symptoms include no decreased concentration,no fatigue, no irritability,no appetite change,nodiaphoresis,no indigestion, no prepanic,and no active suicidal ideas.The symptoms are aggravated by work stress, social issues and medication withdrawal.Past treatments include SSRIs - Selective serotonin reuptake inhibitors, other medications, psychotherapy, SNRIs - Serotonin and norepinephrine reuptake inhibitors and TCAs - Tricyclic antidepressants.Compliance with treatment is variable and good.Past compliance problems include difficulty with treatment plan, medication issues and medical issues.Risk factors include family history, family history of mental illness, family violence, history of mental illness, history of self-injury, major life event, prior traumatic experience and stress. Past medical history includes anxiety,eating  disorder,depressionand mental health disorder. Pertinent negatives include no recent illness,no life-threatening condition,no physical disability,no recent psychiatric admission,no bipolar disorder,no post-traumatic stress disorder,no schizophrenia,no suicide attemptsand no head trauma  Individual Medical History/ Review of Systems: Changes? :Yes , he  has gained 44 pounds in the last 7 weeks off Vyvanse and Wellbutrin after 17 pound gain in the 2 preceding months, patient's goal weight being 200 pounds, stressed that he completed an office demographic for [place] for a general medical exam having the word obesity to be endorsed.  Allergies: Patient has no known allergies.  Current Medications:  Current Outpatient Medications:  .  ALPRAZolam (XANAX) 1 MG tablet, TAKE 1 TABLET(1 MG) BY MOUTH FOUR TIMES DAILY AS NEEDED FOR ANXIETY, Disp: 360 tablet, Rfl: 0 .  busPIRone (BUSPAR) 30 MG tablet, TAKE 1 TABLET(30 MG) BY MOUTH TWICE DAILY, Disp: 180 tablet, Rfl: 0 .  cariprazine (VRAYLAR) capsule, Take 1 capsule (1.5 mg total) by mouth daily., Disp: 42 capsule, Rfl: 0 .  finasteride (PROSCAR) 5 MG tablet, Take 5 mg by mouth., Disp: , Rfl:  .  tamsulosin (FLOMAX) 0.4 MG CAPS capsule, Take 0.4 mg by mouth., Disp: , Rfl:  .  Vilazodone HCl (VIIBRYD) 40 MG TABS, TAKE 1 TABLET(40 MG) BY MOUTH DAILY AFTER SUPPER, Disp: 90 tablet, Rfl: 0 .  zolpidem (AMBIEN) 10 MG tablet, Take 1 tablet (10 mg total) by mouth at bedtime., Disp: 90 tablet, Rfl: 1  Medication Side Effects: anxiety and weight gain  Family Medical/ Social History: Changes? Yes, sad over mother demanding $5000 from him as she was responding to a scam from [employer] demanding money in promise for relationships.  Patient cries as he describes his cat dying in his arms.  MENTAL HEALTH EXAM:  Height 6\' 1"  (1.854 m), weight 268 lb (121.6 kg).Body mass index is 35.36 kg/m. Muscle strengths and tone 5/5, postural reflexes and gait 0/0,  and AIMS = 0.  General Appearance: Casual, Fairly Groomed, Meticulous and Obese  Eye Contact:  Fair  Speech:  Clear and Coherent, Normal Rate and Talkative  Volume:  Normal  Mood:  Anxious, Depressed, Hopeless and Worthless  Affect:  Congruent, Depressed, Inappropriate, Full Range, Tearful and Anxious  Thought Process:  Coherent, Goal Directed, Irrelevant and Descriptions of Associations: Circumstantial  Orientation:  Full (Time, Place, and Person)  Thought Content: Ilusions, Obsessions, Paranoid Ideation and Rumination   Suicidal Thoughts:  Yes.  without intent/plan  Homicidal Thoughts:  No  Memory:  Immediate;   Good Remote;   Good  Judgement:  Fair  Insight:  Fair  Psychomotor Activity:  Normal, Increased, Mannerisms, Psychomotor Retardation and Restlessness  Concentration:  Concentration: Fair and Attention Span: Fair  Recall:  Good  Fund of Knowledge: Good  Language: Good  Assets:  Desire for Improvement Intimacy Resilience Talents/Skills  ADL's:  Intact  Cognition: WNL  Prognosis:  Fair    DIAGNOSES:    ICD-10-CM   1. Generalized anxiety disorder  F41.1   2. Moderate recurrent major depression (H[facility])  F33.1 cariprazine (VRAYLAR) capsule    DISCONTINUED: cariprazine (VRAYLAR) capsule    DISCONTINUED: cariprazine (VRAYLAR) capsule  3. Binge eating disorder  F50.81   4. Other obsessive-compulsive disorder  F42.8   5. Insomnia disorder, with non-sleep disorder mental comorbidity, persistent  F51.05   6. Restless legs syndrome  G25.81     Receiving Psychotherapy: No    RECOMMENDATIONS: From the extensive review of many medications taken in the past, the current treatment need for major depression and generalized anxiety is best updated by adding Vraylar to existing Viibryd, BuSpar, Ambien, and Xanax.  After being initially less anxious off Vyvanse and Wellbutrin, he has gained 44 pounds in 7 weeks causing more anxiety and depression. Hew  has not resolved with wife  currently the late summer 2020 was discontinued last appointment 10 months later.  He has concluded that therapy with Dr. Enis Gash was not helpful even though it did get him back to work relative to panic attacks.  He has ongoing losses and stressors including at work and estimates 4 hours before work each day early afternoon when he is miserable with anxiety otherwise depressed.  He is given samples of Vraylar 1.5 g every morning number 42 capsules for major depression recurrent moderate with comorbid anxieties.  Vraylar eScription incidentally in error transmitted to [facility] for which a second transmission requested it be deleted from the computer and not filled.  He continues Viibryd 40 mg every evening meal, BuSpar 30 mg twice daily, Ambien 10 mg every bedtime, and Xanax 1 mg 4 times daily as needed for anxiety for major depression, generalized anxiety, and other OCD with binge eating disorder not currently having significant panic.Marland Kitchen  He has current supply of all his medications otherwise and is provided the 42 samples capsule of Vraylar.  He did not otherwise attend [facility] group therapy, hospice grief therapy, or Windle Guard, LCSW for OCD therapy.  He defers replacing his cat that died in his arms until he can sufficiently relinquish that grief to be able to have another cat.  He is provided a brochure for [facility] intensive outpatient program which would necessitate time off work to participate at least initially. He returns for follow-up and 4 to 6 weeks or sooner if needed.   Delight Hoh, MD

## 2020-03-31 ENCOUNTER — Ambulatory Visit: Payer: 59 | Admitting: Psychiatry

## 2020-04-22 ENCOUNTER — Ambulatory Visit (INDEPENDENT_AMBULATORY_CARE_PROVIDER_SITE_OTHER): Payer: 59 | Admitting: Psychiatry

## 2020-04-22 ENCOUNTER — Other Ambulatory Visit: Payer: Self-pay

## 2020-04-22 ENCOUNTER — Encounter: Payer: Self-pay | Admitting: Psychiatry

## 2020-04-22 VITALS — Ht 73.0 in | Wt 277.0 lb

## 2020-04-22 DIAGNOSIS — F411 Generalized anxiety disorder: Secondary | ICD-10-CM | POA: Diagnosis not present

## 2020-04-22 DIAGNOSIS — F5081 Binge eating disorder: Secondary | ICD-10-CM

## 2020-04-22 DIAGNOSIS — G2581 Restless legs syndrome: Secondary | ICD-10-CM

## 2020-04-22 DIAGNOSIS — F331 Major depressive disorder, recurrent, moderate: Secondary | ICD-10-CM

## 2020-04-22 DIAGNOSIS — F428 Other obsessive-compulsive disorder: Secondary | ICD-10-CM

## 2020-04-22 DIAGNOSIS — F5105 Insomnia due to other mental disorder: Secondary | ICD-10-CM

## 2020-04-22 MED ORDER — LISDEXAMFETAMINE DIMESYLATE 30 MG PO CAPS: 30.0000 mg | ORAL_CAPSULE | Freq: Every day | ORAL | 0 refills | Status: DC

## 2020-04-22 MED ORDER — DESVENLAFAXINE SUCCINATE ER 50 MG PO TB24: 50.0000 mg | ORAL_TABLET | Freq: Every day | ORAL | 1 refills | Status: DC

## 2020-04-22 NOTE — Progress Notes (Signed)
[facility]  Patient ID: Mario Briggs,  MRN: [id number]  PCP: Suzanna Obey, MD  Date of Evaluation: 04/22/2020 Time spent:20 minutes from 1405 to 1425  Chief Complaint:  Chief Complaint    Depression; Anxiety; Eating Disorder; Stress      HISTORY/CURRENT STATUS: Shann is seen onsite in office 20 minutes face-to-face conjointly with [name] his wife with consent with epic collateral for psychiatric interview and examin 5-week evaluation and management of major depression, generalized anxiety, other OCD/binge eating disorder, restless leg syndrome and insomnia associated with nonsleep disorder mental comorbidity.  Raykwon is somewhat apologetic as he is more capable in the session of explaining his ideas to his wife and making decisions to be able to become more functional and feel better, when he has some basic hopelessness and existential doubt.  He considers himself being somewhat out of character by stating he wants to do at least one constructive thing to get better in the interim and possibly 2.  He reviews that Vraylar caused only akathisia though he does have a history of restless leg syndrome symptoms, so that he stopped the medication within the first week of samples and the akathisia symptoms went away by 3 or 4 days later.  He therefore obtained no relief from depression with Vraylar, but he is more assertive and cognitively able to improve function today stating he could do even better if he had more self-control.  He therefore is willing to restart the Vyvanse at a lower dose stating maybe the 50 mg was too mobilizing of anxiety as could have been the Wellbutrin then helping his wife understand the 30 mg Vyvanse may be tolerable and provide the right amount of relief.  Similarly he becomes willing to change the Viibryd reviewing his many medications in the past choosing confidence in Pristiq as replacementmedication that may help and be tolerable.  In the interim he has  obtained 2 kittens without feeling he compromised his love for the cat that died in his arms before last visit.  He fears job loss stating he can worry about everything but does get online to work each day.  Mother told him that the man who wanted her $5000 has accepted that they are not in love and that she does not have to give him the money to be loved which Jhan can clarify as having been evil.  He has no mania, suicidality, psychosis or delirium.  Depression The patient presents withdepressionas arecurrentproblemwithcurrent episode starting 64monthsago. The onset quality is sudden. The problem occursdaily.Theepisodehas nowbeenrapidly worsening since the death of his cat.Associated symptoms include insomnia,appetite and weightchange,disinterest,existential crisis,passive,nihilistic wish, binge eating, excessive worry,helplessness,hopelessness,andreactivelysad. Associated symptoms include no decreased concentration,nohelplessness, no fatigue, no irritability,nodiaphoresis,no indigestion, no prepanic,and no active suicidal ideas.The symptoms are aggravated by work stress, social issues and medication withdrawal.Past treatments include SSRIs - Selective serotonin reuptake inhibitors, other medications, psychotherapy, SNRIs - Serotonin and norepinephrine reuptake inhibitors and TCAs - Tricyclic antidepressants.Compliance with treatment is variable and good.Past compliance problems include difficulty with treatment plan, medication issues and medical issues.Risk factors include family history, family history of mental illness, family violence, history of mental illness, history of self-injury, major life event, prior traumatic experience and stress. Past medical history includes anxiety,eating disorder,depressionand mental health disorder. Pertinent negatives include no recent illness,no life-threatening condition,no physical disability,no recent  psychiatric admission,no bipolar disorder,no post-traumatic stress disorder,no schizophrenia,no suicide attemptsand no head trauma  Individual Medical History/ Review of Systems: Changes? :Yes Weight is up 11 pounds in 5 weeks.  Allergies: Patient has no known allergies.  Current Medications:  Current Outpatient Medications:  .  ALPRAZolam (XANAX) 1 MG tablet, TAKE 1 TABLET(1 MG) BY MOUTH FOUR TIMES DAILY AS NEEDED FOR ANXIETY, Disp: 360 tablet, Rfl: 0 .  busPIRone (BUSPAR) 30 MG tablet, TAKE 1 TABLET(30 MG) BY MOUTH TWICE DAILY, Disp: 180 tablet, Rfl: 0 .  desvenlafaxine (PRISTIQ) 50 MG 24 hr tablet, Take 1 tablet (50 mg total) by mouth daily after breakfast., Disp: 30 tablet, Rfl: 1 .  finasteride (PROSCAR) 5 MG tablet, Take 5 mg by mouth., Disp: , Rfl:  .  lisdexamfetamine (VYVANSE) 30 MG capsule, Take 1 capsule (30 mg total) by mouth daily after breakfast., Disp: 30 capsule, Rfl: 0 .  tamsulosin (FLOMAX) 0.4 MG CAPS capsule, Take 0.4 mg by mouth., Disp: , Rfl:  .  zolpidem (AMBIEN) 10 MG tablet, Take 1 tablet (10 mg total) by mouth at bedtime., Disp: 90 tablet, Rfl: 1  Medication Side Effects: weight gain and akathisia  Family Medical/ Social History: Changes? No  MENTAL HEALTH EXAM:  Height 6\' 1"  (1.854 m), weight 277 lb (125.6 kg).Body mass index is 36.55 kg/m. Muscle strengths and tone 5/5, postural reflexes and gait 0/0, and AIMS = 0.  General Appearance: Casual, Fairly Groomed, Guarded, Meticulous and Obese  Eye Contact:  Fair  Speech:  Clear and Coherent, Normal Rate and Talkative  Volume:  Normal  Mood:  Anxious, Depressed, Dysphoric, Hopeless and Worthless  Affect:  Congruent, Depressed, Inappropriate, Full Range and Anxious  Thought Process:  Coherent, Goal Directed, Irrelevant and Descriptions of Associations: Circumstantial  Orientation:  Full (Time, Place, and Person)  Thought Content: Ilusions, Obsessions and Rumination   Suicidal Thoughts:  Yes.  without  intent/plan as existential despair  Homicidal Thoughts:  No  Memory:  Immediate;   Good Remote;   Good  Judgement:  Good  Insight:  Good and Fair  Psychomotor Activity:  Normal and Mannerisms  Concentration:  Concentration: Fair and Attention Span: Fair  Recall:  Good  Fund of Knowledge: Good  Language: Good  Assets:  Desire for Improvement Intimacy Resilience Talents/Skills  ADL's:  Intact  Cognition: WNL  Prognosis:  Fair    DIAGNOSES:    ICD-10-CM   1. Moderate recurrent major depression (HCC)  F33.1 lisdexamfetamine (VYVANSE) 30 MG capsule    desvenlafaxine (PRISTIQ) 50 MG 24 hr tablet  2. Generalized anxiety disorder  F41.1 desvenlafaxine (PRISTIQ) 50 MG 24 hr tablet  3. Other obsessive-compulsive disorder  F42.8 desvenlafaxine (PRISTIQ) 50 MG 24 hr tablet  4. Binge eating disorder  F50.81 lisdexamfetamine (VYVANSE) 30 MG capsule    desvenlafaxine (PRISTIQ) 50 MG 24 hr tablet  5. Restless legs syndrome  G25.81   6. Insomnia disorder, with non-sleep disorder mental comorbidity, persistent  F51.05     Receiving Psychotherapy: No    RECOMMENDATIONS: Seng is overcoming helplessness today to have confidence for his wife is well.  He reviews all of the issues of loss, stress, and ruminative reminders of both on the job and in the community so that he decides to modify to things to help himself feel and function better.  He is E scribed Vyvanse 30 mg every morning for binge eating disorder also helpful for his major depression sending a month supply to [facility] [address].  He discontinued Vraylar and will taper to discontinuation Viibryd reversing a starter pack sample given from 20 mg after supper for 2 to 7 days to 10 mg for 2 to 7  days then stop.  He is E scribed Pristiq 50 mg tablet to titrate up from 1 tablet every other morning until off of Viibryd to then continue as 1 every morning #30 with 1 refill sent to [facility] for major depression,  generalized anxiety, and other OCD/binge eating disorder.  He otherwise continues Xanax 1 mg 4 times daily as needed for anxiety or restless legs, BuSpar 30 mg twice daily for generalized anxiety, OCD binge eating disorder, and Ambien 10 mg at bedtime as needed for sleep having current supply of these medications.  Closure discussion of my upcoming but imminent retirement in the next 3 months adding plan to processed options and prepare him for transfer in the office to a younger provider will be addressed again on return here for follow-up in 4 weeks or sooner if necessary.   Delight Hoh, MD

## 2020-04-23 MED ORDER — VIIBRYD STARTER PACK 10 & 20 MG PO KIT: 1.0000 | PACK | Freq: Every day | ORAL | 0 refills | Status: DC

## 2020-05-08 ENCOUNTER — Other Ambulatory Visit: Payer: Self-pay | Admitting: Psychiatry

## 2020-05-08 DIAGNOSIS — F5105 Insomnia due to other mental disorder: Secondary | ICD-10-CM

## 2020-05-08 DIAGNOSIS — F411 Generalized anxiety disorder: Secondary | ICD-10-CM

## 2020-05-08 NOTE — Telephone Encounter (Signed)
90-day refill for Ambien remained in April and a 90 day escription was provided then for Xanax he filled in July now both due as per last appt in Sept appropriate for Epic and Registry the last year.

## 2020-05-08 NOTE — Telephone Encounter (Signed)
Apt 10/20 

## 2020-05-20 ENCOUNTER — Ambulatory Visit (INDEPENDENT_AMBULATORY_CARE_PROVIDER_SITE_OTHER): Payer: 59 | Admitting: Psychiatry

## 2020-05-20 ENCOUNTER — Other Ambulatory Visit: Payer: Self-pay

## 2020-05-20 ENCOUNTER — Encounter: Payer: Self-pay | Admitting: Psychiatry

## 2020-05-20 VITALS — Ht 73.0 in | Wt 280.0 lb

## 2020-05-20 DIAGNOSIS — F411 Generalized anxiety disorder: Secondary | ICD-10-CM | POA: Diagnosis not present

## 2020-05-20 DIAGNOSIS — G2581 Restless legs syndrome: Secondary | ICD-10-CM

## 2020-05-20 DIAGNOSIS — F50819 Binge eating disorder, unspecified: Secondary | ICD-10-CM

## 2020-05-20 DIAGNOSIS — F331 Major depressive disorder, recurrent, moderate: Secondary | ICD-10-CM

## 2020-05-20 DIAGNOSIS — F5081 Binge eating disorder: Secondary | ICD-10-CM | POA: Diagnosis not present

## 2020-05-20 DIAGNOSIS — F5105 Insomnia due to other mental disorder: Secondary | ICD-10-CM

## 2020-05-20 DIAGNOSIS — F428 Other obsessive-compulsive disorder: Secondary | ICD-10-CM | POA: Diagnosis not present

## 2020-05-20 MED ORDER — LISDEXAMFETAMINE DIMESYLATE 30 MG PO CAPS: 30.0000 mg | ORAL_CAPSULE | Freq: Every day | ORAL | 0 refills | Status: DC

## 2020-05-20 MED ORDER — DESVENLAFAXINE SUCCINATE ER 100 MG PO TB24: 100.0000 mg | ORAL_TABLET | Freq: Every day | ORAL | 1 refills | Status: DC

## 2020-05-20 NOTE — Progress Notes (Signed)
[facility]  Patient ID: Mario Briggs,  MRN: [id number]  PCP: Suzanna Obey, MD  Date of Evaluation: 05/20/2020 Time spent:25 minutes from 1320 to 1345  Chief Complaint:  Chief Complaint    Anxiety; Depression; Eating Disorder; Family Problem      HISTORY/CURRENT STATUS: Mario Briggs is seen onsite in office 25 minutes face-to-face individually with consent with epic collateral for psychiatric interview and exam in 4-week evaluation and management of major depression, generalized anxiety, OCD/eating disorder, and multidetermined insomnia.  Patient reports moderate benefit from Pristiq 50 mg over 4 weeks while remorseful for stopping Viibryd as he states it was a painful discontinuation process over 1 to 2 weeks.  Though he did taper and discontinue the Viibryd as he started the Pristiq, he still had a vigilant sense of something being wrong not feeling well seemingly a combination of autonomic discontinuation symptoms and anxiety.  Still he has accomplished getting off Viibryd and onto the Pristiq, and he requests today to increase the Pristiq though his wife emphasizes that he can only expect side effects.  The patient indicates he has more depression than anxiety currently and that he needs more benefit if possible.  [place] registry documents last 30-day supply of Vyvanse dispensed on 04/23/2020, a 90-day supply of Xanax on 05/09/2022, and a 90-day supply of Ambien on 05/08/2020.  The patient now feels somewhat in control of his eating and sleeping but debates with wife today about the benefits and disappointments of treatment, in which wife just withdraws without any affirmation.  He requests to adjust his medication and return for follow-up agreeable to in 6 weeks before my closure of care for imminent retirement as previously discussed, as he inquires today why he should see a provider in this office or elsewhere in town as we again discuss both options.  He continues to decline  psychotherapy.  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  Though such employment may be his greatest stressor, I agree to provide what will be helpful for his changing times though encourage him to approach it with his employer in a way that keeps their focus on his long term positive performance even though we can certainly focus on his problems if helpful for him to gain a better shift on the job as long as he is certain such will not worry him more about losing his job.  He has no mania, suicidality, psychosis or delirium.   Depression The patient presents withdepressionas arecurrentproblemwithcurrent episode starting 44monthsago. The onset quality is sudden. The problem occursdaily.Thisepisodehas now improved afterrapidly worsening since the death of his cat.Associated symptoms include insomnia,disinterest,existential crisis, binge eating, excessive worry,helplessness,andreactivelysad. Associated symptoms include no decreased concentration,nohelplessness, no fatigue, no appetite and weightchange,no irritability,no hopelessness, nodiaphoresis,no indigestion, no prepanic,and nopassive nihilistic or activesuicidal ideas.The symptoms are aggravated by work stress, social issues and medication withdrawal.Past treatments include SSRIs - Selective serotonin reuptake inhibitors, other medications, psychotherapy, SNRIs - Serotonin and norepinephrine reuptake inhibitors and TCAs - Tricyclic antidepressants.Compliance with treatment is variable and good.Past compliance problems include difficulty with treatment plan, medication issues and medical issues.Risk factors include family history, family history of mental illness, family violence, history of mental illness, history of  self-injury, major life event, prior traumatic experience and stress. Past medical history includes anxiety,eating disorder,depressionand mental health disorder. Pertinent negatives include no  recent illness,no life-threatening condition,no physical disability,no recent psychiatric admission,no bipolar disorder,no post-traumatic stress disorder,no schizophrenia,no suicide attemptsand no head trauma  Individual Medical History/ Review of Systems: Changes? :Yes weight is up 3 pounds in the last month as ARFID pattern returns to binge eating disorder pattern patient with patient satisfied with Vyvanse 30 mg and current weight stating he has other things to work on first, though wife is displeased with any of his medications as she emphasizes medications only cause of side effects and he emphasizes that medications did not help her but have helped him  Allergies: Patient has no known allergies.  Current Medications:  Current Outpatient Medications:  .  ALPRAZolam (XANAX) 1 MG tablet, TAKE 1 TABLET(1 MG) BY MOUTH FOUR TIMES DAILY AS NEEDED FOR ANXIETY, Disp: 360 tablet, Rfl: 1 .  busPIRone (BUSPAR) 30 MG tablet, TAKE 1 TABLET(30 MG) BY MOUTH TWICE DAILY, Disp: 180 tablet, Rfl: 0 .  desvenlafaxine (PRISTIQ) 100 MG 24 hr tablet, Take 1 tablet (100 mg total) by mouth daily after breakfast., Disp: 30 tablet, Rfl: 1 .  finasteride (PROSCAR) 5 MG tablet, Take 5 mg by mouth., Disp: , Rfl:  .  lisdexamfetamine (VYVANSE) 30 MG capsule, Take 1 capsule (30 mg total) by mouth daily after breakfast., Disp: 30 capsule, Rfl: 0 .  [START ON 06/22/2020] lisdexamfetamine (VYVANSE) 30 MG capsule, Take 1 capsule (30 mg total) by mouth daily after breakfast., Disp: 30 capsule, Rfl: 0 .  tamsulosin (FLOMAX) 0.4 MG CAPS capsule, Take 0.4 mg by mouth., Disp: , Rfl:  .  zolpidem (AMBIEN) 10 MG tablet, TAKE 1 TABLET(10 MG) BY MOUTH AT BEDTIME, Disp: 90 tablet, Rfl: 1 Medication Side Effects: none  Family Medical/  Social History: Changes? Yes as [name] does update status of conflict and loss relative to sibling and parents wish may be depressing and anxiety provoking at times but which he relinquishes as finding little or nothing he can do to help.  Patient now wishes he had obtained cats not kittens as he reviews the extra work of their care.  MENTAL HEALTH EXAM:  Height 6\' 1"  (1.854 m), weight 280 lb (127 kg).Body mass index is 36.94 kg/m. Muscle strengths and tone 5/5, postural reflexes and gait 0/0, and AIMS = 0.  General Appearance: Casual, Fairly Groomed, Guarded, Meticulous and Obese  Eye Contact:  Fair  Speech:  Clear and Coherent, Normal Rate and Talkative  Volume:  Normal  Mood:  Anxious, Depressed and Worthless  Affect:  Congruent, Depressed, Full Range and Anxious  Thought Process:  Coherent, Goal Directed, Irrelevant and Descriptions of Associations: Circumstantial  Orientation:  Full (Time, Place, and Person)  Thought Content: Ilusions, Obsessions and Rumination   Suicidal Thoughts:  No  Homicidal Thoughts:  No  Memory:  Immediate;   Good Remote;   Good  Judgement:  Good  Insight:  Fair  Psychomotor Activity:  Normal, Mannerisms and Psychomotor Retardation  Concentration:  Concentration: Fair and Attention Span: Fair  Recall:  Good  Fund of Knowledge: Good  Language: Good  Assets:  Desire for Improvement Intimacy Leisure Time Resilience Talents/Skills  ADL's:  Intact  Cognition: WNL  Prognosis:  Fair    DIAGNOSES:    ICD-10-CM   1. Moderate recurrent major depression (HCC)  F33.1 desvenlafaxine (PRISTIQ) 100 MG 24 hr tablet    lisdexamfetamine (VYVANSE) 30 MG capsule    lisdexamfetamine (VYVANSE) 30 MG capsule  2. Other obsessive-compulsive disorder  F42.8 desvenlafaxine (PRISTIQ) 100 MG 24 hr tablet  3. Binge  eating disorder  F50.81 desvenlafaxine (PRISTIQ) 100 MG 24 hr tablet    lisdexamfetamine (VYVANSE) 30 MG capsule    lisdexamfetamine (VYVANSE) 30 MG capsule  4.  Generalized anxiety disorder  F41.1 desvenlafaxine (PRISTIQ) 100 MG 24 hr tablet  5. Insomnia disorder, with non-sleep disorder mental comorbidity, persistent  F51.05   6. Restless legs syndrome  G25.81     Receiving Psychotherapy: No patient now devalues any therapy from the past and declines now or in the future   RECOMMENDATIONS: Patient has one 90-day refill remaining each from the dispensing October 8 for Ambien 10 mg nightly and Xanax 1 mg 4 times daily as needed from [facility] [address] [place] for generalized anxiety, OCD, leg syndrome and insomnia associated with mental health diagnoses.  Patient has BuSpar 30 mg twice daily as a 46-month supply from August 2 with no refills that will need to be updated next appointment.  He is increased on Pristiq 100 mg every morning after breakfast sent as #30 with 1 refill [facility] for depression, OCD, generalized anxiety, and binge eating disorder.  He has E scribed Vyvanse 30 mg every morning sent as #30 each for October 23 and November 22 to [facility] on [address] for binge eating disorder and depression.  Symptom treatment matching is integrated with psychosupportive update on prevention and monitoring safety hygiene possible benefits from psychotherapy are again reviewed.  He returns for follow-up in 6 weeks or sooner if needed as closure of care is being finalized for my imminent retirement.   Delight Hoh, MD

## 2020-05-21 ENCOUNTER — Encounter: Payer: Self-pay | Admitting: Psychiatry

## 2020-06-05 ENCOUNTER — Other Ambulatory Visit: Payer: Self-pay | Admitting: Psychiatry

## 2020-06-05 DIAGNOSIS — F411 Generalized anxiety disorder: Secondary | ICD-10-CM

## 2020-06-05 DIAGNOSIS — F330 Major depressive disorder, recurrent, mild: Secondary | ICD-10-CM

## 2020-07-20 ENCOUNTER — Ambulatory Visit: Payer: 59 | Admitting: Psychiatry

## 2020-07-27 ENCOUNTER — Telehealth: Payer: Self-pay

## 2020-07-27 NOTE — Telephone Encounter (Signed)
Prior authorization submitted and approved for VYVANSE 30 MG effective 06/14/2020-06/14/2021 with [facility], PA# [id number]

## 2020-07-28 ENCOUNTER — Telehealth: Payer: Self-pay | Admitting: Psychiatry

## 2020-07-28 DIAGNOSIS — F5081 Binge eating disorder: Secondary | ICD-10-CM

## 2020-07-28 DIAGNOSIS — F428 Other obsessive-compulsive disorder: Secondary | ICD-10-CM

## 2020-07-28 DIAGNOSIS — F331 Major depressive disorder, recurrent, moderate: Secondary | ICD-10-CM

## 2020-07-28 DIAGNOSIS — F411 Generalized anxiety disorder: Secondary | ICD-10-CM

## 2020-07-28 MED ORDER — DESVENLAFAXINE SUCCINATE ER 100 MG PO TB24: 100.0000 mg | ORAL_TABLET | Freq: Every day | ORAL | 0 refills | Status: DC

## 2020-07-28 MED ORDER — LISDEXAMFETAMINE DIMESYLATE 30 MG PO CAPS: 30.0000 mg | ORAL_CAPSULE | Freq: Every day | ORAL | 0 refills | Status: DC

## 2020-07-28 NOTE — Telephone Encounter (Signed)
Pt requesting refill for Desvenlafaxine 100 mg 1/d   # 90 @ [facility] [place] and Vyvanse 30 day.

## 2020-07-28 NOTE — Telephone Encounter (Signed)
Follow up delayed by physician illness patient having new provider here in March receiving PA for Vyvanse yesterday and Pristiq 100 mg appropropriate with no contraindication in last year per Epic or Registry sending requested #30 and #90 to [facility].

## 2020-07-28 NOTE — Addendum Note (Signed)
Addended by: Chauncey Mann on: 07/28/2020 04:33 PM   Modules accepted: Orders

## 2020-07-29 ENCOUNTER — Ambulatory Visit: Payer: 59 | Admitting: Psychiatry

## 2020-08-31 ENCOUNTER — Telehealth: Payer: Self-pay | Admitting: Psychiatry

## 2020-08-31 DIAGNOSIS — F331 Major depressive disorder, recurrent, moderate: Secondary | ICD-10-CM

## 2020-08-31 DIAGNOSIS — F5081 Binge eating disorder: Secondary | ICD-10-CM

## 2020-08-31 MED ORDER — LISDEXAMFETAMINE DIMESYLATE 30 MG PO CAPS: 30.0000 mg | ORAL_CAPSULE | Freq: Every day | ORAL | 0 refills | Status: DC

## 2020-08-31 NOTE — Telephone Encounter (Signed)
Pt requesting Rx for Vyvanse 30 mg @ [facility] [address]. APT 3/21

## 2020-08-31 NOTE — Telephone Encounter (Signed)
Scripts sent

## 2020-09-29 ENCOUNTER — Telehealth: Payer: Self-pay | Admitting: Psychiatry

## 2020-09-29 ENCOUNTER — Other Ambulatory Visit: Payer: Self-pay | Admitting: Psychiatry

## 2020-09-29 DIAGNOSIS — F5081 Binge eating disorder: Secondary | ICD-10-CM

## 2020-09-29 DIAGNOSIS — F411 Generalized anxiety disorder: Secondary | ICD-10-CM

## 2020-09-29 DIAGNOSIS — F428 Other obsessive-compulsive disorder: Secondary | ICD-10-CM

## 2020-09-29 DIAGNOSIS — F331 Major depressive disorder, recurrent, moderate: Secondary | ICD-10-CM

## 2020-09-29 NOTE — Telephone Encounter (Signed)
LVM to check with pharmacy

## 2020-09-29 NOTE — Telephone Encounter (Signed)
Next visit is 10/19/20.Requesting refill on Vyvanse 30 mg called to [facility] #[id number], phone # is [phone].

## 2020-10-19 ENCOUNTER — Ambulatory Visit (INDEPENDENT_AMBULATORY_CARE_PROVIDER_SITE_OTHER): Payer: 59 | Admitting: Psychiatry

## 2020-10-19 ENCOUNTER — Encounter: Payer: Self-pay | Admitting: Psychiatry

## 2020-10-19 ENCOUNTER — Other Ambulatory Visit: Payer: Self-pay

## 2020-10-19 DIAGNOSIS — F5105 Insomnia due to other mental disorder: Secondary | ICD-10-CM | POA: Diagnosis not present

## 2020-10-19 DIAGNOSIS — F331 Major depressive disorder, recurrent, moderate: Secondary | ICD-10-CM | POA: Diagnosis not present

## 2020-10-19 DIAGNOSIS — F5081 Binge eating disorder: Secondary | ICD-10-CM | POA: Diagnosis not present

## 2020-10-19 DIAGNOSIS — F411 Generalized anxiety disorder: Secondary | ICD-10-CM

## 2020-10-19 MED ORDER — ALPRAZOLAM 1 MG PO TABS
ORAL_TABLET | ORAL | 1 refills | Status: DC
Start: 2020-10-29 — End: 2021-03-29

## 2020-10-19 MED ORDER — GABAPENTIN 300 MG PO CAPS
300.0000 mg | ORAL_CAPSULE | Freq: Three times a day (TID) | ORAL | 2 refills | Status: DC
Start: 2020-10-19 — End: 2021-02-03

## 2020-10-19 MED ORDER — LISDEXAMFETAMINE DIMESYLATE 20 MG PO CAPS: 20.0000 mg | ORAL_CAPSULE | Freq: Every day | ORAL | 0 refills | Status: DC

## 2020-10-19 MED ORDER — BUSPIRONE HCL 30 MG PO TABS
ORAL_TABLET | ORAL | 0 refills | Status: DC
Start: 2020-10-19 — End: 2020-12-21

## 2020-10-19 MED ORDER — ZOLPIDEM TARTRATE 10 MG PO TABS
ORAL_TABLET | ORAL | 1 refills | Status: DC
Start: 2020-10-29 — End: 2021-01-05

## 2020-10-19 NOTE — Progress Notes (Signed)
Mario Briggs [id number] 21-Oct-1970 50 y.o.  Subjective:   Patient ID:  Mario Briggs is a 50 y.o. (DOB 03-Mar-1971) male.  Chief Complaint:  Chief Complaint  Patient presents with  . Anxiety  . Depression  . Insomnia    HPI Mario Briggs presents to the office today for follow-up of anxiety, depression, insomnia. Pt previously seen by Dr. Creig Hines and care is being transferred to this provider due to Dr. Creig Hines' retirement. He is accompanied by his wife.    He reports that he has difficulty with anxiety, to include generalized anxiety. He reports physical s/s of anxiety to include stomach pain and chest pain. He reports having the desire to start running at times and "don't even know where you want to run to."  He reports work stress in response to expectations to complete call within a certain amount of time, not have customers call back, and maintain certain scores on client surveys. He reports that his mother is also a source of anxiety and that she lives an hour away and does not have a car and other financial resources.He reports panic attacks 2-3 times a week.  He reports that he does not want to leave the house. He reports that his energy and motivation have been low.   He reports "bouts of feeling down." He reports that he called in sick last Monday. He reports sleep issues. He reports difficulty staying asleep since time change. Estimates being in bed for 8 hours and sleep is fragmented. Wife denies him snoring or gasping for air.   He reports that his weight has fluctuated. He reports in 2017 he weighed 375 lbs. He was down to 165 lbs in 2018 and now at 310 lbs. He reports that he "uses food as a drug" in response to emotions. He reports that in the past he has eaten to the point of discomfort. He will crave sweets, particularly later in the day. He reports h/o food restriction in the past. He reports that he is "not happy with my weight at all." He reports that Vyvanse helps some with  concentration. Denies SI.   Works for [employer] and has been there 27 years. Works in Therapist, art. Working remotely.  He reports that his wife has been less happy recently and this has been difficult. He took some time off in March and this made it difficult to get back into work routine.   Reports that mother has made poor financial decisions and has "alienated" herself from other family members. He reports that he does not hear from her unless she needs something. Saturday she called him and said she did not have any groceries and needed him to get her groceries. He reports that he has 3 siblings and 2 have not been helpful and one used to help  Awakened by cats.   He reports taking Xanax prn every morning and night. Typically takes it 3-4 times daily  Has seen therapists a few times in the past.   Past Psychiatric Medication Trials: Viibryd-Felt that it may have become less effective over time.  Pristiq Clomipramine Wellbutrin XL Vraylar Latuda Xanax Ambien Trazodone Buspar-Initially helpful Vyvanse- Somewhat helpful for concentration and energy. May cause some restlessness.  Review of Systems:  Review of Systems  HENT: Positive for congestion.   Cardiovascular: Negative for palpitations.  Gastrointestinal: Negative.   Genitourinary: Positive for frequency.  Musculoskeletal: Negative for gait problem.  Neurological: Negative for headaches.  Psychiatric/Behavioral:  Please refer to HPI  He reports that BP is typically in range with occasionally elevated BP.  They report that he has RLS.   Medications: I have reviewed the patient's current medications.  Current Outpatient Medications  Medication Sig Dispense Refill  . desvenlafaxine (PRISTIQ) 100 MG 24 hr tablet TAKE 1 TABLET(100 MG) BY MOUTH DAILY AFTER AND BREAKFAST 90 tablet 0  . fexofenadine (ALLEGRA) 60 MG tablet Take 60 mg by mouth as needed for allergies or rhinitis.    Marland Kitchen gabapentin (NEURONTIN) 300  MG capsule Take 1 capsule (300 mg total) by mouth 3 (three) times daily. 90 capsule 2  . loratadine (CLARITIN) 10 MG tablet Take 10 mg by mouth daily as needed for allergies.    . pseudoephedrine (SUDAFED) 30 MG tablet Take 30 mg by mouth every 4 (four) hours as needed for congestion.    . Pseudoephedrine-APAP-DM (DAYQUIL PO) Take by mouth.    . tamsulosin (FLOMAX) 0.4 MG CAPS capsule Take 0.4 mg by mouth.    Derrill Memo ON 10/29/2020] ALPRAZolam (XANAX) 1 MG tablet TAKE 1 TABLET(1 MG) BY MOUTH FOUR TIMES DAILY AS NEEDED FOR ANXIETY 360 tablet 1  . busPIRone (BUSPAR) 30 MG tablet TAKE 1 TABLET(30 MG) BY MOUTH TWICE DAILY 180 tablet 0  . finasteride (PROSCAR) 5 MG tablet Take 5 mg by mouth. (Patient not taking: Reported on 10/19/2020)    . lisdexamfetamine (VYVANSE) 20 MG capsule Take 1 capsule (20 mg total) by mouth daily after breakfast. 30 capsule 0  . lisdexamfetamine (VYVANSE) 20 MG capsule Take 1 capsule (20 mg total) by mouth daily after breakfast. 30 capsule 0  . [START ON 10/29/2020] zolpidem (AMBIEN) 10 MG tablet TAKE 1 TABLET(10 MG) BY MOUTH AT BEDTIME 90 tablet 1   No current facility-administered medications for this visit.    Medication Side Effects: Other: restlessness with Vyvanse, ED  Allergies: No Known Allergies  History reviewed. No pertinent past medical history.  History reviewed. No pertinent family history.  Social History   Socioeconomic History  . Marital status: Married    Spouse name: Not on file  . Number of children: Not on file  . Years of education: Not on file  . Highest education level: Not on file  Occupational History  . Not on file  Tobacco Use  . Smoking status: Never Smoker  . Smokeless tobacco: Never Used  Vaping Use  . Vaping Use: Never used  Substance and Sexual Activity  . Alcohol use: Not Currently  . Drug use: Never  . Sexual activity: Yes  Other Topics Concern  . Not on file  Social History Narrative  . Not on file   Social  Determinants of Health   Financial Resource Strain: Not on file  Food Insecurity: Not on file  Transportation Needs: Not on file  Physical Activity: Not on file  Stress: Not on file  Social Connections: Not on file  Intimate Partner Violence: Not on file    Past Medical History, Surgical history, Social history, and Family history were reviewed and updated as appropriate.   Please see review of systems for further details on the patient's review from today.   Objective:   Physical Exam:  BP (!) 145/101   Pulse 88   Wt (!) 310 lb (140.6 kg)   BMI 40.90 kg/m   Physical Exam Constitutional:      General: He is not in acute distress. Musculoskeletal:        General: No deformity.  Neurological:  Mental Status: He is alert and oriented to person, place, and time.     Coordination: Coordination normal.  Psychiatric:        Attention and Perception: Attention and perception normal. He does not perceive auditory or visual hallucinations.        Mood and Affect: Mood is anxious and depressed. Affect is not labile, blunt, angry or inappropriate.        Speech: Speech normal.        Behavior: Behavior normal.        Thought Content: Thought content normal. Thought content is not paranoid or delusional. Thought content does not include homicidal or suicidal ideation. Thought content does not include homicidal or suicidal plan.        Cognition and Memory: Cognition and memory normal.        Judgment: Judgment normal.     Comments: Insight intact     Lab Review:  No results found for: NA, K, CL, CO2, GLUCOSE, BUN, CREATININE, CALCIUM, PROT, ALBUMIN, AST, ALT, ALKPHOS, BILITOT, GFRNONAA, GFRAA  No results found for: WBC, RBC, HGB, HCT, PLT, MCV, MCH, MCHC, RDW, LYMPHSABS, MONOABS, EOSABS, BASOSABS  No results found for: POCLITH, LITHIUM   No results found for: PHENYTOIN, PHENOBARB, VALPROATE, CBMZ   .res Assessment: Plan:   Discussed avoiding combining Vyvanse with  decongestants since this could worsen anxiety and increase heart rate. Discussed trial of lower dose of Vyvanse since he and his wife report that he has increased restlessness with Vyvanse and that it is somewhat beneficial for some s/s such as energy, motivation, and concentration.  Discussed potential benefits, risks, and side effects of Gabapentin. Discussed that Gabapentin is used off-label for anxiety and RLS. Pt agrees to trial of Gabapentin. Will start Gabapentin 300 mg po TID for anxiety.  Continue Xanax 1 mg po four times daily as needed for anxiety.  Continue Buspar 30 mg po BID for anxiety.  Continue Pristiq 100 mg po qd for depression.  Continue Ambien 10 mg po QHS for insomnia. May consider Modafinil in the future in lieu of Vyvanse.  Pt to follow-up in 6-8 weeks or sooner if clinically indicated.  Patient advised to contact office with any questions, adverse effects, or acute worsening in signs and symptoms.  Chanler was seen today for anxiety, depression and insomnia.  Diagnoses and all orders for this visit:  Moderate recurrent major depression (HCC) -     lisdexamfetamine (VYVANSE) 20 MG capsule; Take 1 capsule (20 mg total) by mouth daily after breakfast. -     lisdexamfetamine (VYVANSE) 20 MG capsule; Take 1 capsule (20 mg total) by mouth daily after breakfast.  Binge eating disorder -     lisdexamfetamine (VYVANSE) 20 MG capsule; Take 1 capsule (20 mg total) by mouth daily after breakfast. -     lisdexamfetamine (VYVANSE) 20 MG capsule; Take 1 capsule (20 mg total) by mouth daily after breakfast.  Generalized anxiety disorder -     ALPRAZolam (XANAX) 1 MG tablet; TAKE 1 TABLET(1 MG) BY MOUTH FOUR TIMES DAILY AS NEEDED FOR ANXIETY -     busPIRone (BUSPAR) 30 MG tablet; TAKE 1 TABLET(30 MG) BY MOUTH TWICE DAILY -     gabapentin (NEURONTIN) 300 MG capsule; Take 1 capsule (300 mg total) by mouth 3 (three) times daily.  Insomnia disorder, with non-sleep disorder mental  comorbidity, persistent -     ALPRAZolam (XANAX) 1 MG tablet; TAKE 1 TABLET(1 MG) BY MOUTH FOUR TIMES DAILY AS NEEDED FOR  ANXIETY -     zolpidem (AMBIEN) 10 MG tablet; TAKE 1 TABLET(10 MG) BY MOUTH AT BEDTIME     Please see After Visit Summary for patient specific instructions.  Future Appointments  Date Time Provider [place]  12/07/2020  1:45 PM Thayer Headings, PMHNP CP-[facility] None    No orders of the defined types were placed in this encounter.   -------------------------------

## 2020-12-07 ENCOUNTER — Ambulatory Visit: Payer: 59 | Admitting: Psychiatry

## 2020-12-19 ENCOUNTER — Other Ambulatory Visit: Payer: Self-pay | Admitting: Psychiatry

## 2020-12-19 DIAGNOSIS — F411 Generalized anxiety disorder: Secondary | ICD-10-CM

## 2020-12-24 ENCOUNTER — Other Ambulatory Visit: Payer: Self-pay

## 2020-12-24 ENCOUNTER — Telehealth: Payer: Self-pay | Admitting: Psychiatry

## 2020-12-24 DIAGNOSIS — F331 Major depressive disorder, recurrent, moderate: Secondary | ICD-10-CM

## 2020-12-24 DIAGNOSIS — F5081 Binge eating disorder: Secondary | ICD-10-CM

## 2020-12-24 MED ORDER — LISDEXAMFETAMINE DIMESYLATE 20 MG PO CAPS: 20.0000 mg | ORAL_CAPSULE | Freq: Every day | ORAL | 0 refills | Status: DC

## 2020-12-24 NOTE — Telephone Encounter (Signed)
Next visit is 01/19/21. Requesting refill on Vyvanse 20 mg called to:  [id number] - [place], [place] - [address] AT [address] Phone:  [phone]  Fax:  [phone]

## 2020-12-24 NOTE — Telephone Encounter (Signed)
pended

## 2021-01-05 ENCOUNTER — Other Ambulatory Visit: Payer: Self-pay

## 2021-01-05 ENCOUNTER — Ambulatory Visit (INDEPENDENT_AMBULATORY_CARE_PROVIDER_SITE_OTHER): Payer: 59 | Admitting: Psychiatry

## 2021-01-05 ENCOUNTER — Encounter: Payer: Self-pay | Admitting: Psychiatry

## 2021-01-05 DIAGNOSIS — F332 Major depressive disorder, recurrent severe without psychotic features: Secondary | ICD-10-CM | POA: Diagnosis not present

## 2021-01-05 DIAGNOSIS — F5105 Insomnia due to other mental disorder: Secondary | ICD-10-CM | POA: Diagnosis not present

## 2021-01-05 MED ORDER — ZOLPIDEM TARTRATE ER 12.5 MG PO TBCR: 12.5000 mg | EXTENDED_RELEASE_TABLET | Freq: Every evening | ORAL | 0 refills | Status: DC | PRN

## 2021-01-05 MED ORDER — REXULTI 0.5 MG PO TABS: 0.5000 mg | ORAL_TABLET | Freq: Every day | ORAL | 0 refills | Status: DC

## 2021-01-05 NOTE — Progress Notes (Signed)
Mario Briggs [id number] [birth year] 50 y.o.  Subjective:   Patient ID:  Mario Briggs is a 50 y.o. (DOB May 28, 1971) male.  Chief Complaint:  Chief Complaint  Patient presents with   Depression   Anxiety   Insomnia    HPI Mario Briggs presents emergently to the office today for follow-up of depression, anxiety, and insomnia. He reports recent exacerbation of depression with worsening depression over the last month. He reports increased depression, anergia, low motivation. He has been staying in bed watching TV. He reports that he has not been wanting to go out and "it's a real chore to go out to [facility] to pick up medication right now." He reports that he has not been doing any housework or chores.   He reports "coping with my depression by eating and shopping." He reports that food and buying things are the only enjoyment he has. He reports that he is making mostly small purchases with the exception of a tablet. He ended up returning the tablet. He reports some of the things that he has bought are "still in the box because I haven't felt like setting them up." He reports that he eats cake and ice cream after work. He denies eating to the point of physical discomfort. He reports last night he had pizza and fries. He reports that he was down to 165 lbs and "I'm so disappointed that I went back."   "I really don't have a lot to look forward to."   He and his wife report that he has had anxiety. They report that he has anxiety with anticipation of getting on the phone for work for 8 hours and "feel trapped and wondering how I am going to get through that 8 hours." He reports some panic s/s with very upset stomach, nausea, tightness in chest, diarrhea, and need to flee. He reports, "I worry about everything... things I have not control over." He describes catastrophic thinking. He reports sense of "something is about to go bad."  He reports that his watch indicates he is sleeping 5 hours a night  and he feels like he sleeps longer than that. He reports poor concentration and focus. He reports that work performance has been decreased. He reports difficulty sustaining focus while watching TV. Denies SI.   He reports that he has not been able to work his normal shift and has used all of his sick time for the year. His work is allowing him to make-up time on the weekends and this results in having to work multiple consecutive days.    Wife reports that she thinks Vyvanse may make him more restless and he disagrees with this.   He reports that Gabapentin may have helped some. He is not sure if Gabapentin is contributing to fatigue. Taking Xanax prn upon awakening,  before starting work, at bedtime and occasionally while working.   Works for [employer].   Past Psychiatric Medication Trials: Viibryd-Felt that it may have become less effective over time.  Pristiq Clomipramine Wellbutrin XL Vraylar Latuda Xanax Ambien Trazodone Buspar-Initially helpful Vyvanse- Somewhat helpful for concentration and energy. May cause some restlessness. Gabapentin  Review of Systems:  Review of Systems  HENT:  Positive for congestion.   Endocrine: Positive for heat intolerance.       He reports "feeling hot all the time" despite keeping thermostat at 67.   Musculoskeletal:  Negative for gait problem.  Psychiatric/Behavioral:         Please refer to HPI  Will start another course of Prednisone today.   Medications: I have reviewed the patient's current medications.  Current Outpatient Medications  Medication Sig Dispense Refill   ALPRAZolam (XANAX) 1 MG tablet TAKE 1 TABLET(1 MG) BY MOUTH FOUR TIMES DAILY AS NEEDED FOR ANXIETY 360 tablet 1   Brexpiprazole (REXULTI) 0.5 MG TABS Take 1 tablet (0.5 mg total) by mouth daily. Increase to 1 mg daily 7 tablet 0   desvenlafaxine (PRISTIQ) 100 MG 24 hr tablet TAKE 1 TABLET(100 MG) BY MOUTH DAILY AFTER AND BREAKFAST 90 tablet 0   fexofenadine  (ALLEGRA) 60 MG tablet Take 60 mg by mouth as needed for allergies or rhinitis.     lisdexamfetamine (VYVANSE) 20 MG capsule Take 1 capsule (20 mg total) by mouth daily after breakfast. 30 capsule 0   loratadine (CLARITIN) 10 MG tablet Take 10 mg by mouth daily as needed for allergies.     tamsulosin (FLOMAX) 0.4 MG CAPS capsule Take 0.4 mg by mouth.     zolpidem (AMBIEN CR) 12.5 MG CR tablet Take 1 tablet (12.5 mg total) by mouth at bedtime as needed for sleep. 30 tablet 0   busPIRone (BUSPAR) 30 MG tablet TAKE 1 TABLET(30 MG) BY MOUTH TWICE DAILY 180 tablet 0   finasteride (PROSCAR) 5 MG tablet Take 5 mg by mouth. (Patient not taking: Reported on 10/19/2020)     fluticasone (FLONASE) 50 MCG/ACT nasal spray SMARTSIG:1-2 Spray(s) Both Nares Daily     gabapentin (NEURONTIN) 300 MG capsule Take 1 capsule (300 mg total) by mouth 3 (three) times daily. 90 capsule 2   lisdexamfetamine (VYVANSE) 20 MG capsule Take 1 capsule (20 mg total) by mouth daily after breakfast. 30 capsule 0   montelukast (SINGULAIR) 10 MG tablet Take 1 tablet by mouth daily.     Olopatadine HCl 0.6 % SOLN Place into both nostrils.     pseudoephedrine (SUDAFED) 30 MG tablet Take 30 mg by mouth every 4 (four) hours as needed for congestion. (Patient not taking: Reported on 01/05/2021)     Pseudoephedrine-APAP-DM (DAYQUIL PO) Take by mouth. (Patient not taking: Reported on 01/05/2021)     No current facility-administered medications for this visit.    Medication Side Effects: Other: Possible fatigue with Gabapentin  Allergies: No Known Allergies  History reviewed. No pertinent past medical history.  Past Medical History, Surgical history, Social history, and Family history were reviewed and updated as appropriate.   Please see review of systems for further details on the patient's review from today.   Objective:   Physical Exam:  There were no vitals taken for this visit.  Physical Exam Constitutional:      General: He  is not in acute distress. Musculoskeletal:        General: No deformity.  Neurological:     Mental Status: He is alert and oriented to person, place, and time.     Coordination: Coordination normal.  Psychiatric:        Attention and Perception: Attention and perception normal. He does not perceive auditory or visual hallucinations.        Mood and Affect: Mood is anxious and depressed. Affect is not labile, blunt, angry or inappropriate.        Speech: Speech normal.        Behavior: Behavior is slowed. Behavior is cooperative.        Thought Content: Thought content normal. Thought content is not paranoid or delusional. Thought content does not include homicidal or suicidal ideation. Thought  content does not include homicidal or suicidal plan.        Cognition and Memory: Cognition and memory normal.        Judgment: Judgment normal.     Comments: Insight intact    Lab Review:  No results found for: NA, K, CL, CO2, GLUCOSE, BUN, CREATININE, CALCIUM, PROT, ALBUMIN, AST, ALT, ALKPHOS, BILITOT, GFRNONAA, GFRAA  No results found for: WBC, RBC, HGB, HCT, PLT, MCV, MCH, MCHC, RDW, LYMPHSABS, MONOABS, EOSABS, BASOSABS  No results found for: POCLITH, LITHIUM   No results found for: PHENYTOIN, PHENOBARB, VALPROATE, CBMZ   .res Assessment: Plan:    Patient seen for 30 minutes and time spent discussing treatment options for worsening depression and anxiety.  Discussed considering intermittent FMLA for instances when anxiety flares and he misses parts of his workday. Discussed potential benefits, risks, and side effects of Rexulti. Discussed potential metabolic side effects associated with atypical antipsychotics, as well as potential risk for movement side effects. Advised pt to contact office if movement side effects occur.  Patient agrees to trial of Rexulti.  Will start Rexulti 0.5 mg daily for 1 week, then increase to 1 mg daily for augmentation of depression. Discussed considering  discontinuation of Vyvanse in the future since this has had limited benefit and could potentially be exacerbating anxiety. Continue Xanax as needed for anxiety. Continue BuSpar 30 mg twice daily for anxiety. Continue Ambien 12.5 mg at bedtime for insomnia. Continue Pristiq 100 mg daily for depression. Patient to follow-up in 2 to 3 weeks or sooner if clinically indicated. Patient advised to contact office with any questions, adverse effects, or acute worsening in signs and symptoms.    Lavert was seen today for depression, anxiety and insomnia.  Diagnoses and all orders for this visit:  Severe episode of recurrent major depressive disorder, without psychotic features ([place]) -     Brexpiprazole (REXULTI) 0.5 MG TABS; Take 1 tablet (0.5 mg total) by mouth daily. Increase to 1 mg daily  Insomnia disorder, with non-sleep disorder mental comorbidity, persistent -     zolpidem (AMBIEN CR) 12.5 MG CR tablet; Take 1 tablet (12.5 mg total) by mouth at bedtime as needed for sleep.    Please see After Visit Summary for patient specific instructions.  Future Appointments  Date Time Provider [place]  01/19/2021  1:30 PM Thayer Headings, PMHNP CP-CP None    No orders of the defined types were placed in this encounter.   -------------------------------

## 2021-01-07 ENCOUNTER — Telehealth: Payer: Self-pay | Admitting: Psychiatry

## 2021-01-07 NOTE — Telephone Encounter (Signed)
Next visit is 01/19/21. Requesting refill on Desvenlafaxine 100 mg called to [facility], [address] at [facility] [employer] and [address], [place], [place]. Phone number is [phone]

## 2021-01-08 ENCOUNTER — Other Ambulatory Visit: Payer: Self-pay

## 2021-01-08 DIAGNOSIS — F411 Generalized anxiety disorder: Secondary | ICD-10-CM

## 2021-01-08 DIAGNOSIS — F5081 Binge eating disorder: Secondary | ICD-10-CM

## 2021-01-08 DIAGNOSIS — F331 Major depressive disorder, recurrent, moderate: Secondary | ICD-10-CM

## 2021-01-08 DIAGNOSIS — F428 Other obsessive-compulsive disorder: Secondary | ICD-10-CM

## 2021-01-08 MED ORDER — DESVENLAFAXINE SUCCINATE ER 100 MG PO TB24: ORAL_TABLET | ORAL | 0 refills | Status: DC

## 2021-01-08 NOTE — Telephone Encounter (Signed)
Rx sent 

## 2021-01-11 ENCOUNTER — Other Ambulatory Visit: Payer: Self-pay

## 2021-01-11 DIAGNOSIS — F411 Generalized anxiety disorder: Secondary | ICD-10-CM

## 2021-01-11 DIAGNOSIS — F50819 Binge eating disorder, unspecified: Secondary | ICD-10-CM

## 2021-01-11 DIAGNOSIS — F5081 Binge eating disorder: Secondary | ICD-10-CM

## 2021-01-11 DIAGNOSIS — F331 Major depressive disorder, recurrent, moderate: Secondary | ICD-10-CM

## 2021-01-11 DIAGNOSIS — F428 Other obsessive-compulsive disorder: Secondary | ICD-10-CM

## 2021-01-11 MED ORDER — DESVENLAFAXINE SUCCINATE ER 100 MG PO TB24: ORAL_TABLET | ORAL | 0 refills | Status: DC

## 2021-01-11 NOTE — Telephone Encounter (Signed)
Patient lm stating he requested a 90 day supply due to insurance. Please resubmit if possible to [facility]. Direct questions to # [phone].

## 2021-01-11 NOTE — Telephone Encounter (Signed)
Rx sent 

## 2021-01-19 ENCOUNTER — Ambulatory Visit (INDEPENDENT_AMBULATORY_CARE_PROVIDER_SITE_OTHER): Payer: 59 | Admitting: Psychiatry

## 2021-01-19 ENCOUNTER — Encounter: Payer: Self-pay | Admitting: Psychiatry

## 2021-01-19 ENCOUNTER — Other Ambulatory Visit: Payer: Self-pay

## 2021-01-19 DIAGNOSIS — F332 Major depressive disorder, recurrent severe without psychotic features: Secondary | ICD-10-CM

## 2021-01-19 MED ORDER — BREXPIPRAZOLE 1 MG PO TABS: 1.0000 mg | ORAL_TABLET | Freq: Every day | ORAL | 0 refills | Status: DC

## 2021-01-19 NOTE — Progress Notes (Signed)
Mario Briggs [id number] 07-Feb-1971 50 y.o.  Subjective:   Patient ID:  Mario Briggs is a 50 y.o. (DOB 03/02/1971) male.  Chief Complaint:  Chief Complaint  Patient presents with   Depression   Anxiety    HPI Mario Briggs presents to the office today for follow-up of depression, anxiety, insomnia, and binge eating. He reports, "I think I am a little better than I was last time." He reports that his energy and motivation remain low. He notices some improvement in mood. He reports that he was able to complete work every day last week. He reports that he had to leave work early yesterday and felt like he had "weights strapped to his arms and legs." He reports that he continues to shop and eat in response to depression. He reports that he often returns items unopened.   He reports that he may have increased difficulty with sleep initiation with Ambien CR. He denies any significant increase in duration of sleep. Concentration is ok. He feels like he has been focusing on work ok. He reports increased appetite. Reports that he has been pouring condensed milk on ice cream. He has been eating fries and pizza. He reports some passive death wishes. Denies SI.   He reports that his anxiety is highest in the mornings and has anticipatory anxiety about work. He reports upset stomach and gagging prior to work. He reports that worry has been less.   Works 3:30 pm- midnight Monday -Thursday. Mario Briggs Office Visit from 01/19/2021 in [facility]  AIMS Total Score 0       Taking Xanax prn 3-4 a day.  Past Psychiatric Medication Trials: Viibryd-Felt that it may have become less effective over time. Pristiq Clomipramine Wellbutrin XL Vraylar Latuda Xanax Ambien Ambien CR Trazodone Buspar-Initially helpful Vyvanse- Somewhat helpful for concentration and energy. May cause some restlessness. Gabapentin  Review of Systems:  Review of Systems  Gastrointestinal:         Occ heartburn  Musculoskeletal:  Negative for gait problem.  Neurological:  Negative for tremors.  Psychiatric/Behavioral:         Please refer to HPI   Medications: I have reviewed the patient's current medications.  Current Outpatient Medications  Medication Sig Dispense Refill   ALPRAZolam (XANAX) 1 MG tablet TAKE 1 TABLET(1 MG) BY MOUTH FOUR TIMES DAILY AS NEEDED FOR ANXIETY 360 tablet 1   brexpiprazole (REXULTI) 1 MG TABS tablet Take 1 tablet (1 mg total) by mouth daily. 21 tablet 0   busPIRone (BUSPAR) 30 MG tablet TAKE 1 TABLET(30 MG) BY MOUTH TWICE DAILY 180 tablet 0   desvenlafaxine (PRISTIQ) 100 MG 24 hr tablet TAKE 1 TABLET(100 MG) BY MOUTH DAILY AFTER AND BREAKFAST 90 tablet 0   gabapentin (NEURONTIN) 300 MG capsule Take 1 capsule (300 mg total) by mouth 3 (three) times daily. 90 capsule 2   loratadine (CLARITIN) 10 MG tablet Take 10 mg by mouth daily as needed for allergies.     montelukast (SINGULAIR) 10 MG tablet Take 1 tablet by mouth daily.     pseudoephedrine (SUDAFED) 30 MG tablet Take 30 mg by mouth every 4 (four) hours as needed for congestion.     tamsulosin (FLOMAX) 0.4 MG CAPS capsule Take 0.4 mg by mouth.     zolpidem (AMBIEN CR) 12.5 MG CR tablet Take 1 tablet (12.5 mg total) by mouth at bedtime as needed for sleep. 30 tablet 0   finasteride (PROSCAR) 5 MG tablet Take  5 mg by mouth. (Patient not taking: Reported on 10/19/2020)     fluticasone (FLONASE) 50 MCG/ACT nasal spray SMARTSIG:1-2 Spray(s) Both Nares Daily     Olopatadine HCl 0.6 % SOLN Place into both nostrils.     Pseudoephedrine-APAP-DM (DAYQUIL PO) Take by mouth. (Patient not taking: Reported on 01/05/2021)     No current facility-administered medications for this visit.    Medication Side Effects: Other: ? Increased appetite  Allergies: No Known Allergies  History reviewed. No pertinent past medical history.  Past Medical History, Surgical history, Social history, and Family history were reviewed  and updated as appropriate.   Please see review of systems for further details on the patient's review from today.   Objective:   Physical Exam:  There were no vitals taken for this visit.  Physical Exam Constitutional:      General: He is not in acute distress. Musculoskeletal:        General: No deformity.  Neurological:     Mental Status: He is alert and oriented to person, place, and time.     Coordination: Coordination normal.  Psychiatric:        Attention and Perception: Attention and perception normal. He does not perceive auditory or visual hallucinations.        Mood and Affect: Mood is anxious and depressed. Affect is not labile, blunt, angry or inappropriate.        Speech: Speech normal.        Behavior: Behavior normal.        Thought Content: Thought content normal. Thought content is not paranoid or delusional. Thought content does not include homicidal or suicidal ideation. Thought content does not include homicidal or suicidal plan.        Cognition and Memory: Cognition and memory normal.        Judgment: Judgment normal.     Comments: Insight intact Mood presents as less anxious and less depressed compared to last exam    Lab Review:  No results found for: NA, K, CL, CO2, GLUCOSE, BUN, CREATININE, CALCIUM, PROT, ALBUMIN, AST, ALT, ALKPHOS, BILITOT, GFRNONAA, GFRAA  No results found for: WBC, RBC, HGB, HCT, PLT, MCV, MCH, MCHC, RDW, LYMPHSABS, MONOABS, EOSABS, BASOSABS  No results found for: POCLITH, LITHIUM   No results found for: PHENYTOIN, PHENOBARB, VALPROATE, CBMZ   .res Assessment: Plan:   Patient seen for 30 minutes and time spent counseling the patient regarding treatment plan.  Agreed that Rexulti seems to be beneficial for his mood and anxiety signs and symptoms.  Recommend continuing 1 mg dose at this time since he could continue to see improvement at this dose after recent increase.  Discussed considering increasing to 2 mg in the future based on  response. Recommend switching Pristiq to morning since Pristiq can cause activation and may help with energy during the day and minimize sleep disturbance that night. Will stop Vyvanse due to possible increase in anxiety and since binge eating has not improved with Vyvanse.  Continue Xanax as needed for anxiety. Continue BuSpar 30 mg twice daily for anxiety. Continue Pristiq 100 mg daily for depression and anxiety. Continue gabapentin 300 mg 3 times daily for anxiety. Continue Ambien CR 12.5 mg at bedtime for insomnia.  Recommend change in work schedule to day shift with start time around 9-10 am since this would likely help regulate sleep-wake cycle, minimize anticipatory anxiety before work, and improved work-life balance. Patient to follow-up in 1 month or sooner if clinically indicated. Patient advised  to contact office with any questions, adverse effects, or acute worsening in signs and symptoms.    Nicandro was seen today for depression and anxiety.  Diagnoses and all orders for this visit:  Severe episode of recurrent major depressive disorder, without psychotic features ([place]) -     brexpiprazole (REXULTI) 1 MG TABS tablet; Take 1 tablet (1 mg total) by mouth daily.    Please see After Visit Summary for patient specific instructions.  Future Appointments  Date Time Provider [place]  02/19/2021 11:30 AM Thayer Headings, PMHNP CP-CP None    No orders of the defined types were placed in this encounter.   -------------------------------

## 2021-01-19 NOTE — Progress Notes (Signed)
   01/19/21 1402  Facial and Oral Movements  Muscles of Facial Expression 0  Lips and Perioral Area 0  Jaw 0  Tongue 0  Extremity Movements  Upper (arms, wrists, hands, fingers) 0  Lower (legs, knees, ankles, toes) 0  Trunk Movements  Neck, shoulders, hips 0  Overall Severity  Severity of abnormal movements (highest score from questions above) 0  Incapacitation due to abnormal movements 0  Patient's awareness of abnormal movements (rate only patient's report) 0  AIMS Total Score  AIMS Total Score 0

## 2021-02-02 ENCOUNTER — Telehealth: Payer: Self-pay | Admitting: Psychiatry

## 2021-02-02 NOTE — Telephone Encounter (Signed)
Pt left a message that he needs a refill on his gabapentin he needs a 90 day supply sent to his pharmacy the [facility] on [address] [place]. He also said that he wants to go back on the vyvanse 20 mg. He said that he can tell that his motivationi s not good being off of it. So he would like to go back on the medicine

## 2021-02-03 ENCOUNTER — Other Ambulatory Visit: Payer: Self-pay

## 2021-02-03 DIAGNOSIS — F411 Generalized anxiety disorder: Secondary | ICD-10-CM

## 2021-02-03 MED ORDER — LISDEXAMFETAMINE DIMESYLATE 20 MG PO CAPS: 20.0000 mg | ORAL_CAPSULE | Freq: Every day | ORAL | 0 refills | Status: DC

## 2021-02-03 MED ORDER — GABAPENTIN 300 MG PO CAPS: 300.0000 mg | ORAL_CAPSULE | Freq: Three times a day (TID) | ORAL | 0 refills | Status: DC

## 2021-02-03 NOTE — Telephone Encounter (Signed)
Rx's pended for Mario Briggs to review and send

## 2021-02-03 NOTE — Telephone Encounter (Signed)
Pt asking to go back on his Vyvanse 20 mg? Okay to add again?  Also request for 90 day Gabapentin, will send Rx

## 2021-02-10 ENCOUNTER — Other Ambulatory Visit: Payer: Self-pay

## 2021-02-10 DIAGNOSIS — F5105 Insomnia due to other mental disorder: Secondary | ICD-10-CM

## 2021-02-10 MED ORDER — ZOLPIDEM TARTRATE ER 12.5 MG PO TBCR: 12.5000 mg | EXTENDED_RELEASE_TABLET | Freq: Every evening | ORAL | 0 refills | Status: DC | PRN

## 2021-02-18 ENCOUNTER — Other Ambulatory Visit: Payer: Self-pay | Admitting: Otolaryngology

## 2021-02-18 DIAGNOSIS — J329 Chronic sinusitis, unspecified: Secondary | ICD-10-CM

## 2021-02-19 ENCOUNTER — Encounter: Payer: Self-pay | Admitting: Psychiatry

## 2021-02-19 ENCOUNTER — Other Ambulatory Visit: Payer: Self-pay

## 2021-02-19 ENCOUNTER — Ambulatory Visit (INDEPENDENT_AMBULATORY_CARE_PROVIDER_SITE_OTHER): Payer: 59 | Admitting: Psychiatry

## 2021-02-19 VITALS — BP 128/80 | HR 85

## 2021-02-19 DIAGNOSIS — F5081 Binge eating disorder: Secondary | ICD-10-CM

## 2021-02-19 DIAGNOSIS — F411 Generalized anxiety disorder: Secondary | ICD-10-CM | POA: Diagnosis not present

## 2021-02-19 DIAGNOSIS — R5383 Other fatigue: Secondary | ICD-10-CM

## 2021-02-19 DIAGNOSIS — F331 Major depressive disorder, recurrent, moderate: Secondary | ICD-10-CM | POA: Diagnosis not present

## 2021-02-19 DIAGNOSIS — F332 Major depressive disorder, recurrent severe without psychotic features: Secondary | ICD-10-CM | POA: Diagnosis not present

## 2021-02-19 DIAGNOSIS — Z79899 Other long term (current) drug therapy: Secondary | ICD-10-CM | POA: Diagnosis not present

## 2021-02-19 DIAGNOSIS — F428 Other obsessive-compulsive disorder: Secondary | ICD-10-CM

## 2021-02-19 DIAGNOSIS — F5105 Insomnia due to other mental disorder: Secondary | ICD-10-CM

## 2021-02-19 MED ORDER — BREXPIPRAZOLE 2 MG PO TABS: 2.0000 mg | ORAL_TABLET | Freq: Every day | ORAL | 1 refills | Status: DC

## 2021-02-19 MED ORDER — BUSPIRONE HCL 30 MG PO TABS: ORAL_TABLET | ORAL | 0 refills | Status: DC

## 2021-02-19 MED ORDER — DESVENLAFAXINE SUCCINATE ER 100 MG PO TB24: ORAL_TABLET | ORAL | 0 refills | Status: DC

## 2021-02-19 MED ORDER — ZOLPIDEM TARTRATE ER 12.5 MG PO TBCR: 12.5000 mg | EXTENDED_RELEASE_TABLET | Freq: Every evening | ORAL | 2 refills | Status: DC | PRN

## 2021-02-19 MED ORDER — LISDEXAMFETAMINE DIMESYLATE 20 MG PO CAPS: 20.0000 mg | ORAL_CAPSULE | Freq: Every day | ORAL | 0 refills | Status: DC

## 2021-02-19 NOTE — Progress Notes (Signed)
Dawayne Higby RN:[id number] 1970-10-31 50 y.o.  Subjective:   Patient ID:  Mario Briggs is a 50 y.o. (DOB [birth year]) male.  Chief Complaint:  Chief Complaint  Patient presents with   Depression   Anxiety     HPI Mario Briggs presents to the office today for follow-up of depression, anxiety, and sleep disturbance. Accompanied by wife. "The Rexulti stuff helped but I still don't think I am back to where I need to be." He reports that he was excessively tired without Vyvanse and this improved some with re-starting Vyvanse. He reports that at times he will not work and then makes up for missed work on the weekend. "It's a will power issue." He also reports that there are some avoidant behaviors with delaying work. Motivation is low. Low energy at times. He reports occasional days where he does not want to get out of bed. He reports that he feels less sad. He reports that he is easily frustrated and not as patient. He reports that he continues to have anxiety. He reports that gabapentin helps with some physical s/s of anxiety. Notices anxious thoughts. He reports having anticipatory anxiety about work calls and "you never know what is going to come through the line." He reports worrying about finances. Denies panic attacks.   Sleeping well and reports that smart watch indicates he is sleeping more. Less middle of the night awakenings. Notices awakenings at the end of the night due to back pain. He reports adequate concentration. Wife reports that his concentration "could be better." He reports that he typically is doing multiple activities at once.   Wife reports that he has been spending excessively and denies this. He reports some impulsive buying on smaller items. Denies any change in binging and reports that he got rid of junk food and is eating more meals. He reports that his eating is "better during the day" and on the days he works. He continues to have self "critical" thoughts about binge eating,  his weight, etc. Denies SI.   He reports that his work indicated he would need to start FMLA process to change shifts.  Past Psychiatric Medication Trials: Viibryd-Felt that it may have become less effective over time. Pristiq Clomipramine Wellbutrin XL Vraylar Latuda Xanax Ambien Ambien CR Trazodone Buspar-Initially helpful Vyvanse- Somewhat helpful for concentration and energy. May cause some restlessness. Gabapentin  AIMS    Flowsheet Row Office Visit from 01/19/2021 in [facility]  AIMS Total Score 0        Review of Systems:  Review of Systems  Musculoskeletal:  Positive for arthralgias and back pain. Negative for gait problem.  Neurological:  Negative for tremors.  Psychiatric/Behavioral:         Please refer to HPI   Medications: I have reviewed the patient's current medications.  Current Outpatient Medications  Medication Sig Dispense Refill   finasteride (PROSCAR) 5 MG tablet Take 5 mg by mouth.     tamsulosin (FLOMAX) 0.4 MG CAPS capsule Take 0.4 mg by mouth.     ALPRAZolam (XANAX) 1 MG tablet TAKE 1 TABLET(1 MG) BY MOUTH FOUR TIMES DAILY AS NEEDED FOR ANXIETY 360 tablet 1   brexpiprazole (REXULTI) 2 MG TABS tablet Take 1 tablet (2 mg total) by mouth daily. 30 tablet 1   busPIRone (BUSPAR) 30 MG tablet TAKE 1 TABLET(30 MG) BY MOUTH TWICE DAILY 180 tablet 0   desvenlafaxine (PRISTIQ) 100 MG 24 hr tablet TAKE 1 TABLET(100 MG) BY MOUTH DAILY AFTER AND BREAKFAST  90 tablet 0   fluticasone (FLONASE) 50 MCG/ACT nasal spray SMARTSIG:1-2 Spray(s) Both Nares Daily     gabapentin (NEURONTIN) 300 MG capsule Take 1 capsule (300 mg total) by mouth 3 (three) times daily. 270 capsule 0   [START ON 03/03/2021] lisdexamfetamine (VYVANSE) 20 MG capsule Take 1 capsule (20 mg total) by mouth daily. 30 capsule 0   loratadine (CLARITIN) 10 MG tablet Take 10 mg by mouth daily as needed for allergies.     montelukast (SINGULAIR) 10 MG tablet Take 1 tablet by mouth  daily.     Olopatadine HCl 0.6 % SOLN Place into both nostrils.     pseudoephedrine (SUDAFED) 30 MG tablet Take 30 mg by mouth every 4 (four) hours as needed for congestion.     Pseudoephedrine-APAP-DM (DAYQUIL PO) Take by mouth. (Patient not taking: Reported on 01/05/2021)     [START ON 03/10/2021] zolpidem (AMBIEN CR) 12.5 MG CR tablet Take 1 tablet (12.5 mg total) by mouth at bedtime as needed for sleep. 30 tablet 2   No current facility-administered medications for this visit.    Medication Side Effects: None  Allergies: No Known Allergies  History reviewed. No pertinent past medical history.  Past Medical History, Surgical history, Social history, and Family history were reviewed and updated as appropriate.   Please see review of systems for further details on the patient's review from today.   Objective:   Physical Exam:  BP 128/80   Pulse 85   Physical Exam Constitutional:      General: He is not in acute distress. Musculoskeletal:        General: No deformity.  Neurological:     Mental Status: He is alert and oriented to person, place, and time.     Coordination: Coordination normal.  Psychiatric:        Attention and Perception: Attention and perception normal. He does not perceive auditory or visual hallucinations.        Mood and Affect: Mood is anxious and depressed. Affect is not labile, blunt, angry or inappropriate.        Speech: Speech normal.        Behavior: Behavior normal.        Thought Content: Thought content normal. Thought content is not paranoid or delusional. Thought content does not include homicidal or suicidal ideation. Thought content does not include homicidal or suicidal plan.        Cognition and Memory: Cognition and memory normal.        Judgment: Judgment normal.     Comments: Insight intact    Lab Review:  No results found for: NA, K, CL, CO2, GLUCOSE, BUN, CREATININE, CALCIUM, PROT, ALBUMIN, AST, ALT, ALKPHOS, BILITOT, GFRNONAA,  GFRAA  No results found for: WBC, RBC, HGB, HCT, PLT, MCV, MCH, MCHC, RDW, LYMPHSABS, MONOABS, EOSABS, BASOSABS  No results found for: POCLITH, LITHIUM   No results found for: PHENYTOIN, PHENOBARB, VALPROATE, CBMZ   .res Assessment: Plan:    Pt seen for 30 minutes and time spent counseling pt regarding potential benefits, risks, and side effects of increasing Rexulti to 2 mg since he has experienced a partial response to lower doses of Rexulti.  Continue Xanax 1 mg po four times daily as needed for anxiety.  Continue Buspar 30 mg po BID for anxiety.  Continue Gabapentin 300 mg po TID for anxiety.  Continue Vyvanse 20 mg po qd since he had worsening energy and concentration with discontinuation of Vyvanse.  Continue Ambien CR 12.5  mg po QHS prn insomnia.  Will obtain labs to evaluate for possible adverse effects, to establish baseline, and to r/o medical causes of fatigue.  Encouraged pt to consider FMLA accommodations and to contact HR at his work if he decided to proceed.  Pt to follow-up in 4 weeks or sooner if clinically indicated.  Patient advised to contact office with any questions, adverse effects, or acute worsening in signs and symptoms.   Mario Briggs was seen today for depression and anxiety.  Diagnoses and all orders for this visit:  Moderate recurrent major depression (HCC) -     brexpiprazole (REXULTI) 2 MG TABS tablet; Take 1 tablet (2 mg total) by mouth daily. -     desvenlafaxine (PRISTIQ) 100 MG 24 hr tablet; TAKE 1 TABLET(100 MG) BY MOUTH DAILY AFTER AND BREAKFAST -     VITAMIN D 25 Hydroxy (Vit-D Deficiency, Fractures)  High risk medication use -     Hemoglobin A1c -     Comprehensive metabolic panel -     Lipid panel  Severe episode of recurrent major depressive disorder, without psychotic features (HCC)  Generalized anxiety disorder -     busPIRone (BUSPAR) 30 MG tablet; TAKE 1 TABLET(30 MG) BY MOUTH TWICE DAILY -     desvenlafaxine (PRISTIQ) 100 MG 24 hr  tablet; TAKE 1 TABLET(100 MG) BY MOUTH DAILY AFTER AND BREAKFAST  Binge eating disorder -     desvenlafaxine (PRISTIQ) 100 MG 24 hr tablet; TAKE 1 TABLET(100 MG) BY MOUTH DAILY AFTER AND BREAKFAST -     lisdexamfetamine (VYVANSE) 20 MG capsule; Take 1 capsule (20 mg total) by mouth daily.  Other obsessive-compulsive disorder -     desvenlafaxine (PRISTIQ) 100 MG 24 hr tablet; TAKE 1 TABLET(100 MG) BY MOUTH DAILY AFTER AND BREAKFAST  Insomnia disorder, with non-sleep disorder mental comorbidity, persistent -     zolpidem (AMBIEN CR) 12.5 MG CR tablet; Take 1 tablet (12.5 mg total) by mouth at bedtime as needed for sleep.  Fatigue, unspecified type -     VITAMIN D 25 Hydroxy (Vit-D Deficiency, Fractures)    Please see After Visit Summary for patient specific instructions.  Future Appointments  Date Time Provider [place]  03/29/2021 11:30 AM Thayer Headings, PMHNP CP-CP None    Orders Placed This Encounter  Procedures   Hemoglobin A1c   Comprehensive metabolic panel   Lipid panel   VITAMIN D 25 Hydroxy (Vit-D Deficiency, Fractures)    -------------------------------

## 2021-03-04 LAB — VITAMIN D 25 HYDROXY (VIT D DEFICIENCY, FRACTURES): Vit D, 25-Hydroxy: 33 ng/mL (ref 30–100)

## 2021-03-04 LAB — LIPID PANEL
Cholesterol: 174 mg/dL (ref <200)
HDL: 51 mg/dL (ref >=40)
LDL Cholesterol (Calc): 85 mg/dL (calc)
Non-HDL Cholesterol (Calc): 123 mg/dL (calc) (ref <130)
Total CHOL/HDL Ratio: 3.4 (calc) (ref <5.0)
Triglycerides: 289 mg/dL — ABNORMAL HIGH (ref <150)

## 2021-03-04 LAB — COMPREHENSIVE METABOLIC PANEL
AG Ratio: 1.7 (calc) (ref 1.0–2.5)
ALT: 25 U/L (ref 9–46)
AST: 20 U/L (ref 10–40)
Albumin: 4.3 g/dL (ref 3.6–5.1)
Alkaline phosphatase (APISO): 72 U/L (ref 36–130)
BUN: 10 mg/dL (ref 7–25)
CO2: 27 mmol/L (ref 20–32)
Calcium: 9.7 mg/dL (ref 8.6–10.3)
Chloride: 105 mmol/L (ref 98–110)
Creat: 1.21 mg/dL (ref 0.60–1.29)
Globulin: 2.5 g/dL (calc) (ref 1.9–3.7)
Glucose, Bld: 95 mg/dL (ref 65–139)
Potassium: 4.7 mmol/L (ref 3.5–5.3)
Sodium: 142 mmol/L (ref 135–146)
Total Bilirubin: 0.3 mg/dL (ref 0.2–1.2)
Total Protein: 6.8 g/dL (ref 6.1–8.1)

## 2021-03-04 LAB — HEMOGLOBIN A1C
Hgb A1c MFr Bld: 5.2 % of total Hgb (ref <5.7)
Mean Plasma Glucose: 103 mg/dL
eAG (mmol/L): 5.7 mmol/L

## 2021-03-04 NOTE — Progress Notes (Signed)
Pt informed

## 2021-03-15 ENCOUNTER — Ambulatory Visit
Admission: RE | Admit: 2021-03-15 | Discharge: 2021-03-15 | Disposition: A | Discharge status: Home / Self Care | Payer: 59 | Source: Ambulatory Visit | Attending: Otolaryngology | Admitting: Otolaryngology

## 2021-03-15 DIAGNOSIS — J329 Chronic sinusitis, unspecified: Secondary | ICD-10-CM

## 2021-03-22 ENCOUNTER — Other Ambulatory Visit: Payer: Self-pay | Admitting: Otolaryngology

## 2021-03-29 ENCOUNTER — Encounter: Payer: Self-pay | Admitting: Psychiatry

## 2021-03-29 ENCOUNTER — Other Ambulatory Visit: Payer: Self-pay

## 2021-03-29 ENCOUNTER — Ambulatory Visit (INDEPENDENT_AMBULATORY_CARE_PROVIDER_SITE_OTHER): Payer: 59 | Admitting: Psychiatry

## 2021-03-29 VITALS — BP 127/81 | HR 94 | Wt 368.0 lb

## 2021-03-29 DIAGNOSIS — F411 Generalized anxiety disorder: Secondary | ICD-10-CM

## 2021-03-29 DIAGNOSIS — F5105 Insomnia due to other mental disorder: Secondary | ICD-10-CM

## 2021-03-29 DIAGNOSIS — F5081 Binge eating disorder: Secondary | ICD-10-CM | POA: Diagnosis not present

## 2021-03-29 DIAGNOSIS — F331 Major depressive disorder, recurrent, moderate: Secondary | ICD-10-CM

## 2021-03-29 DIAGNOSIS — R5383 Other fatigue: Secondary | ICD-10-CM

## 2021-03-29 DIAGNOSIS — F50819 Binge eating disorder, unspecified: Secondary | ICD-10-CM

## 2021-03-29 DIAGNOSIS — F428 Other obsessive-compulsive disorder: Secondary | ICD-10-CM

## 2021-03-29 MED ORDER — DESVENLAFAXINE SUCCINATE ER 100 MG PO TB24: ORAL_TABLET | ORAL | 0 refills | Status: DC

## 2021-03-29 MED ORDER — MODAFINIL 200 MG PO TABS: ORAL_TABLET | ORAL | 1 refills | Status: DC

## 2021-03-29 MED ORDER — ALPRAZOLAM 1 MG PO TABS: ORAL_TABLET | ORAL | 1 refills | Status: DC

## 2021-03-29 MED ORDER — BREXPIPRAZOLE 2 MG PO TABS: 2.0000 mg | ORAL_TABLET | Freq: Every day | ORAL | 1 refills | Status: DC

## 2021-03-29 MED ORDER — BUSPIRONE HCL 30 MG PO TABS: ORAL_TABLET | ORAL | 0 refills | Status: DC

## 2021-03-29 NOTE — Progress Notes (Signed)
   03/29/21 1153  Facial and Oral Movements  Muscles of Facial Expression 0  Lips and Perioral Area 0  Jaw 0  Tongue 0  Extremity Movements  Upper (arms, wrists, hands, fingers) 0  Lower (legs, knees, ankles, toes) 0  Trunk Movements  Neck, shoulders, hips 0  Overall Severity  Severity of abnormal movements (highest score from questions above) 0  Incapacitation due to abnormal movements 0  Patient's awareness of abnormal movements (rate only patient's report) 0  AIMS Total Score  AIMS Total Score 0

## 2021-03-29 NOTE — Progress Notes (Signed)
Juliun Brandel RN:[id number] 1970-09-24 49 y.o.  Subjective:   Patient ID:  Mario Briggs is a 50 y.o. (DOB 15-Jun-1971) male.  Chief Complaint:  Chief Complaint  Patient presents with   Depression   Anxiety    HPI Mario Briggs presents to the office today for follow-up of anxiety, depression, and sleep disturbance. Accompanied by wife. He reports that he took off work last week and had less anxiety. Returns to work this evening and has anticipatory anxiety about this today. "There's just so much pressure to perform there." Metrics are evaluated monthly. In April and May he was in the bottom 10%. He reports that his metrics were ok in June and July. He reports that he has days where it is hard to perform his job due to anxiety. He is occasionally not working and then making up the day which leads to working 7-8 consecutive days. He reports that he has occ panic attacks. He reports that in some ways his anxiety is better when work is busier compared to days when it is slower because he tends to think about what type of call he could get next.   Motivation has been low and "not wanting to do things that need to be done." Energy is also low. Reports alertness and energy are lower when he does not take Vyvanse. He reports sad mood is stable. Rates depression a 7 with 10= most depressed mood imaginable. Concentration has been ok. Not eating as many sweets. Occ binging. He reports that he may be eating somewhat less overall. Sleeping ok. Occ snores. They has not noticed him gasping or choking in his sleep. Occ awakens with back pain. Denies SI- "I feel a lot better in that regard." Has been thinking more about the future.   Mother is having surgery for breast cancer on Wednesday. He reports some worry about his mother.   Past Psychiatric Medication Trials: Viibryd-Felt that it may have become less effective over time. Pristiq Clomipramine Wellbutrin XL Vraylar Rexulti Latuda Xanax Ambien Ambien  CR Trazodone Buspar-Initially helpful Vyvanse- Somewhat helpful for concentration and energy. May cause some restlessness. Gabapentin  AIMS    Flowsheet Row Office Visit from 03/29/2021 in [facility] Visit from 01/19/2021 in [facility]  AIMS Total Score 0 0        Review of Systems:  Review of Systems  HENT:         Has surgery scheduled for nasal septum  Cardiovascular:  Positive for leg swelling.  Genitourinary:  Positive for frequency.  Musculoskeletal:  Negative for gait problem.  Neurological:  Negative for tremors.  Psychiatric/Behavioral:         Please refer to HPI   Medications: I have reviewed the patient's current medications.  Current Outpatient Medications  Medication Sig Dispense Refill   cholecalciferol (VITAMIN D3) 25 MCG (1000 UNIT) tablet Take 1,000 Units by mouth 2 (two) times daily.     fexofenadine (ALLEGRA) 60 MG tablet Take 60 mg by mouth 2 (two) times daily.     finasteride (PROSCAR) 5 MG tablet Take 5 mg by mouth.     fluticasone (FLONASE) 50 MCG/ACT nasal spray SMARTSIG:1-2 Spray(s) Both Nares Daily     gabapentin (NEURONTIN) 300 MG capsule Take 1 capsule (300 mg total) by mouth 3 (three) times daily. 270 capsule 0   loratadine (CLARITIN) 10 MG tablet Take 10 mg by mouth daily as needed for allergies.     modafinil (PROVIGIL) 200 MG tablet Take 1/2-1  tablet in the morning 30 tablet 1   montelukast (SINGULAIR) 10 MG tablet Take 1 tablet by mouth daily.     Olopatadine HCl 0.6 % SOLN Place into both nostrils.     pseudoephedrine (SUDAFED) 30 MG tablet Take 30 mg by mouth every 4 (four) hours as needed for congestion.     tamsulosin (FLOMAX) 0.4 MG CAPS capsule Take 0.4 mg by mouth.     zolpidem (AMBIEN CR) 12.5 MG CR tablet Take 1 tablet (12.5 mg total) by mouth at bedtime as needed for sleep. 30 tablet 2   [START ON 04/15/2021] ALPRAZolam (XANAX) 1 MG tablet TAKE 1 TABLET(1 MG) BY MOUTH FOUR TIMES DAILY AS  NEEDED FOR ANXIETY 360 tablet 1   brexpiprazole (REXULTI) 2 MG TABS tablet Take 1 tablet (2 mg total) by mouth daily. 90 tablet 1   busPIRone (BUSPAR) 30 MG tablet TAKE 1 TABLET(30 MG) BY MOUTH TWICE DAILY 180 tablet 0   desvenlafaxine (PRISTIQ) 100 MG 24 hr tablet TAKE 1 TABLET(100 MG) BY MOUTH DAILY AFTER AND BREAKFAST 90 tablet 0   Pseudoephedrine-APAP-DM (DAYQUIL PO) Take by mouth. (Patient not taking: Reported on 01/05/2021)     No current facility-administered medications for this visit.    Medication Side Effects: None  Allergies: No Known Allergies  History reviewed. No pertinent past medical history.  Past Medical History, Surgical history, Social history, and Family history were reviewed and updated as appropriate.   Please see review of systems for further details on the patient's review from today.   Objective:   Physical Exam:  BP 127/81   Pulse 94   Wt (!) 368 lb (166.9 kg)   BMI 48.55 kg/m   Physical Exam Constitutional:      General: He is not in acute distress. Musculoskeletal:        General: No deformity.  Neurological:     Mental Status: He is alert and oriented to person, place, and time.     Coordination: Coordination normal.  Psychiatric:        Attention and Perception: Attention and perception normal. He does not perceive auditory or visual hallucinations.        Mood and Affect: Affect is not labile, blunt, angry or inappropriate.        Speech: Speech normal.        Behavior: Behavior normal.        Thought Content: Thought content normal. Thought content is not paranoid or delusional. Thought content does not include homicidal or suicidal ideation. Thought content does not include homicidal or suicidal plan.        Cognition and Memory: Cognition and memory normal.        Judgment: Judgment normal.     Comments: Insight intact Mood is less anxious and less depressed compared to previous exams.     Lab Review:     Component Value Date/Time    NA 142 03/03/2021 1339   K 4.7 03/03/2021 1339   CL 105 03/03/2021 1339   CO2 27 03/03/2021 1339   GLUCOSE 95 03/03/2021 1339   BUN 10 03/03/2021 1339   CREATININE 1.21 03/03/2021 1339   CALCIUM 9.7 03/03/2021 1339   PROT 6.8 03/03/2021 1339   AST 20 03/03/2021 1339   ALT 25 03/03/2021 1339   BILITOT 0.3 03/03/2021 1339    No results found for: WBC, RBC, HGB, HCT, PLT, MCV, MCH, MCHC, RDW, LYMPHSABS, MONOABS, EOSABS, BASOSABS  No results found for: POCLITH, LITHIUM   No  results found for: PHENYTOIN, PHENOBARB, VALPROATE, CBMZ   .res Assessment: Plan:   Discussed potential benefits, risks, and side effects of Modafinil for treatment of excessive somnolence and fatigue since pt asks about increase in Vyvanse to help with fatigue. Discussed that he had increased anxiety in the past with higher doses of Vyvanse and Vyvanse has been minimally effective for his binge eating. Discussed that sleep apnea may be contributing to fatigue since he and his wife notice he has been snoring after gaining weight. Pt agrees to d/c of Vyvanse and trial of Modafinil 200 mg 1/2-1 tab po q am. Discussed that Modafinil could also be taking 1/2 tab po q am and 1/2 tab po q noon if needed.  Continue Rexulti 2 mg po qd for mood s/s.  Continue Buspar 30 mg po BID for anxiety.  Continue Pristiq 100 mg po qd for anxiety and depression.  Continue Xanax for anxiety.  Continue Gabapentin 300 mg po TID for anxiety. Continue Ambien CR 12.5 mg po QHS prn insomnia.  Pt to follow-up in 1 month or sooner if clinically indicated.  Patient advised to contact office with any questions, adverse effects, or acute worsening in signs and symptoms.   Mario Briggs was seen today for depression and anxiety.  Diagnoses and all orders for this visit:  Fatigue, unspecified type -     modafinil (PROVIGIL) 200 MG tablet; Take 1/2-1 tablet in the morning  Generalized anxiety disorder -     ALPRAZolam (XANAX) 1 MG tablet; TAKE 1 TABLET(1  MG) BY MOUTH FOUR TIMES DAILY AS NEEDED FOR ANXIETY -     desvenlafaxine (PRISTIQ) 100 MG 24 hr tablet; TAKE 1 TABLET(100 MG) BY MOUTH DAILY AFTER AND BREAKFAST -     busPIRone (BUSPAR) 30 MG tablet; TAKE 1 TABLET(30 MG) BY MOUTH TWICE DAILY  Insomnia disorder, with non-sleep disorder mental comorbidity, persistent -     ALPRAZolam (XANAX) 1 MG tablet; TAKE 1 TABLET(1 MG) BY MOUTH FOUR TIMES DAILY AS NEEDED FOR ANXIETY  Moderate recurrent major depression (HCC) -     desvenlafaxine (PRISTIQ) 100 MG 24 hr tablet; TAKE 1 TABLET(100 MG) BY MOUTH DAILY AFTER AND BREAKFAST -     brexpiprazole (REXULTI) 2 MG TABS tablet; Take 1 tablet (2 mg total) by mouth daily.  Binge eating disorder -     desvenlafaxine (PRISTIQ) 100 MG 24 hr tablet; TAKE 1 TABLET(100 MG) BY MOUTH DAILY AFTER AND BREAKFAST  Other obsessive-compulsive disorder -     desvenlafaxine (PRISTIQ) 100 MG 24 hr tablet; TAKE 1 TABLET(100 MG) BY MOUTH DAILY AFTER AND BREAKFAST    Please see After Visit Summary for patient specific instructions.  Future Appointments  Date Time Provider [place]  04/26/2021  1:30 PM Thayer Headings, PMHNP [facility] None     No orders of the defined types were placed in this encounter.   -------------------------------

## 2021-04-26 ENCOUNTER — Ambulatory Visit: Payer: 59 | Admitting: Psychiatry

## 2021-04-29 ENCOUNTER — Other Ambulatory Visit: Payer: Self-pay

## 2021-04-29 ENCOUNTER — Ambulatory Visit (INDEPENDENT_AMBULATORY_CARE_PROVIDER_SITE_OTHER): Payer: 59 | Admitting: Psychiatry

## 2021-04-29 ENCOUNTER — Encounter: Payer: Self-pay | Admitting: Psychiatry

## 2021-04-29 DIAGNOSIS — F331 Major depressive disorder, recurrent, moderate: Secondary | ICD-10-CM

## 2021-04-29 DIAGNOSIS — F5081 Binge eating disorder: Secondary | ICD-10-CM | POA: Diagnosis not present

## 2021-04-29 DIAGNOSIS — F411 Generalized anxiety disorder: Secondary | ICD-10-CM

## 2021-04-29 DIAGNOSIS — F428 Other obsessive-compulsive disorder: Secondary | ICD-10-CM | POA: Diagnosis not present

## 2021-04-29 MED ORDER — DESVENLAFAXINE SUCCINATE ER 100 MG PO TB24: ORAL_TABLET | ORAL | 0 refills | Status: DC

## 2021-04-29 MED ORDER — LISDEXAMFETAMINE DIMESYLATE 30 MG PO CAPS: 30.0000 mg | ORAL_CAPSULE | Freq: Every day | ORAL | 0 refills | Status: DC

## 2021-04-29 MED ORDER — LISDEXAMFETAMINE DIMESYLATE 30 MG PO CAPS
30.0000 mg | ORAL_CAPSULE | Freq: Every day | ORAL | 0 refills | Status: DC
Start: 2021-06-24 — End: 2021-06-28

## 2021-04-29 MED ORDER — BUSPIRONE HCL 30 MG PO TABS: ORAL_TABLET | ORAL | 0 refills | Status: DC

## 2021-04-29 MED ORDER — GABAPENTIN 300 MG PO CAPS: 300.0000 mg | ORAL_CAPSULE | Freq: Three times a day (TID) | ORAL | 0 refills | Status: DC

## 2021-04-29 NOTE — Progress Notes (Signed)
Mario Briggs [id number] Dec 15, 1970 50 y.o.  Subjective:   Patient ID:  Mario Briggs is a 50 y.o. (DOB Feb 08, 1971) male.  Chief Complaint:  Chief Complaint  Patient presents with   Fatigue   Follow-up    Anxiety, depression, binge eating    HPI Mario Briggs presents to the office today for follow-up of anxiety, depression, and binge eating. He is accompanied by his wife. He reports that motivation and desire to do things remain low. He asks about increasing Vyvanse. He reports modafinil has helped him feel more "awake." He reports that Vyvanse seems to be more helpful for energy and motivation compared to Modafinil. He reports that his concentration has been "about the same." He reports that he does better at work when it is busy- "maybe because I don't have time to think." He will occasionally drop the end of his shift when it is slow and make it up on the weekends, which his wife does not like. He reports that his mood has been "good." He reports that depression is currently controlled. He reports anxiety is highest with "sitting there waiting on the next call and thinking about what it might be." He reports anxiety before work is less. Sleeping 7-8 hours a night and uses an alarm to get up. He reports some binge eating, particularly after work. He is trying to eat things that are not as sweet. Denies SI.   He will be switching to days next week and "I feel like that will be helpful for my mental state." New hours will be Tues-Saturday, 10 am - 6:30 pm.  He reports that he and his wife "seem to fight a lot."   Past Psychiatric Medication Trials: Viibryd-Felt that it may have become less effective over time. Pristiq Clomipramine Wellbutrin XL Vraylar Rexulti Latuda Xanax Ambien Ambien CR Trazodone Buspar-Initially helpful Vyvanse- Somewhat helpful for concentration and energy. May cause some restlessness. Gabapentin   AIMS    Flowsheet Row Office Visit from 03/29/2021 in  [place] Visit from 01/19/2021 in [facility]  AIMS Total Score 0 0        Review of Systems:  Review of Systems  Musculoskeletal:  Positive for back pain. Negative for gait problem.  Neurological:  Negative for tremors.  Psychiatric/Behavioral:         Please refer to HPI   Medications: I have reviewed the patient's current medications.  Current Outpatient Medications  Medication Sig Dispense Refill   [START ON 05/27/2021] lisdexamfetamine (VYVANSE) 30 MG capsule Take 1 capsule (30 mg total) by mouth daily. 30 capsule 0   [START ON 06/24/2021] lisdexamfetamine (VYVANSE) 30 MG capsule Take 1 capsule (30 mg total) by mouth daily. 30 capsule 0   ALPRAZolam (XANAX) 1 MG tablet TAKE 1 TABLET(1 MG) BY MOUTH FOUR TIMES DAILY AS NEEDED FOR ANXIETY 360 tablet 1   brexpiprazole (REXULTI) 2 MG TABS tablet Take 1 tablet (2 mg total) by mouth daily. 90 tablet 1   busPIRone (BUSPAR) 30 MG tablet TAKE 1 TABLET(30 MG) BY MOUTH TWICE DAILY 180 tablet 0   cholecalciferol (VITAMIN D3) 25 MCG (1000 UNIT) tablet Take 1,000 Units by mouth 2 (two) times daily.     desvenlafaxine (PRISTIQ) 100 MG 24 hr tablet TAKE 1 TABLET(100 MG) BY MOUTH DAILY AFTER AND BREAKFAST 90 tablet 0   fexofenadine (ALLEGRA) 60 MG tablet Take 60 mg by mouth 2 (two) times daily.     finasteride (PROSCAR) 5 MG tablet Take 5 mg  by mouth.     fluticasone (FLONASE) 50 MCG/ACT nasal spray SMARTSIG:1-2 Spray(s) Both Nares Daily     gabapentin (NEURONTIN) 300 MG capsule Take 1 capsule (300 mg total) by mouth 3 (three) times daily. 270 capsule 0   lisdexamfetamine (VYVANSE) 30 MG capsule Take 1 capsule (30 mg total) by mouth daily after breakfast. 30 capsule 0   loratadine (CLARITIN) 10 MG tablet Take 10 mg by mouth daily as needed for allergies.     montelukast (SINGULAIR) 10 MG tablet Take 1 tablet by mouth daily.     Olopatadine HCl 0.6 % SOLN Place into both nostrils.     pseudoephedrine  (SUDAFED) 30 MG tablet Take 30 mg by mouth every 4 (four) hours as needed for congestion.     Pseudoephedrine-APAP-DM (DAYQUIL PO) Take by mouth. (Patient not taking: Reported on 01/05/2021)     tamsulosin (FLOMAX) 0.4 MG CAPS capsule Take 0.4 mg by mouth.     zolpidem (AMBIEN CR) 12.5 MG CR tablet Take 1 tablet (12.5 mg total) by mouth at bedtime as needed for sleep. 30 tablet 2   No current facility-administered medications for this visit.    Medication Side Effects: Other: Sexual side effects  Allergies: No Known Allergies  History reviewed. No pertinent past medical history.  Past Medical History, Surgical history, Social history, and Family history were reviewed and updated as appropriate.   Please see review of systems for further details on the patient's review from today.   Objective:   Physical Exam:  BP 127/85   Pulse 89   Physical Exam Constitutional:      General: He is not in acute distress. Musculoskeletal:        General: No deformity.  Neurological:     Mental Status: He is alert and oriented to person, place, and time.     Coordination: Coordination normal.  Psychiatric:        Attention and Perception: Attention and perception normal. He does not perceive auditory or visual hallucinations.        Mood and Affect: Mood is anxious. Mood is not depressed. Affect is not labile, blunt, angry or inappropriate.        Speech: Speech normal.        Behavior: Behavior normal.        Thought Content: Thought content normal. Thought content is not paranoid or delusional. Thought content does not include homicidal or suicidal ideation. Thought content does not include homicidal or suicidal plan.        Cognition and Memory: Cognition and memory normal.        Judgment: Judgment normal.     Comments: Insight intact    Lab Review:     Component Value Date/Time   NA 142 03/03/2021 1339   K 4.7 03/03/2021 1339   CL 105 03/03/2021 1339   CO2 27 03/03/2021 1339    GLUCOSE 95 03/03/2021 1339   BUN 10 03/03/2021 1339   CREATININE 1.21 03/03/2021 1339   CALCIUM 9.7 03/03/2021 1339   PROT 6.8 03/03/2021 1339   AST 20 03/03/2021 1339   ALT 25 03/03/2021 1339   BILITOT 0.3 03/03/2021 1339    No results found for: WBC, RBC, HGB, HCT, PLT, MCV, MCH, MCHC, RDW, LYMPHSABS, MONOABS, EOSABS, BASOSABS  No results found for: POCLITH, LITHIUM   No results found for: PHENYTOIN, PHENOBARB, VALPROATE, CBMZ   .res Assessment: Plan:   Will d/c Modafinil since it was not as effective as Vyvanse for energy,  motivation, and concentration.  Will resume Vyvanse 30 mg po q am for binge eating disorder.  Continue Rexulti 2 mg po qd for augmentation of depression. He reports that he is experiencing sexual side effects and that benefits are outweighing side effects at this time. Encouraged pt to notify provider if side effects worsen and/or become intolerable in the future and he would like to consider dose reduction or change in treatment.  Continue Pristiq 100 mg po qd for depression and anxiety.  Continue Gabapentin 300 mg po TID for anxiety.  Continue Buspar 30 mg po BID for anxiety.  Continue Ambien CR 12.5 mg po QHS prn insomnia.  Pt to follow-up in 2 months or sooner if clinically indicated.  Patient advised to contact office with any questions, adverse effects, or acute worsening in signs and symptoms.   Aston was seen today for fatigue and follow-up.  Diagnoses and all orders for this visit:  Generalized anxiety disorder -     busPIRone (BUSPAR) 30 MG tablet; TAKE 1 TABLET(30 MG) BY MOUTH TWICE DAILY -     desvenlafaxine (PRISTIQ) 100 MG 24 hr tablet; TAKE 1 TABLET(100 MG) BY MOUTH DAILY AFTER AND BREAKFAST -     gabapentin (NEURONTIN) 300 MG capsule; Take 1 capsule (300 mg total) by mouth 3 (three) times daily.  Moderate recurrent major depression (HCC) -     desvenlafaxine (PRISTIQ) 100 MG 24 hr tablet; TAKE 1 TABLET(100 MG) BY MOUTH DAILY AFTER AND  BREAKFAST -     lisdexamfetamine (VYVANSE) 30 MG capsule; Take 1 capsule (30 mg total) by mouth daily after breakfast.  Binge eating disorder -     desvenlafaxine (PRISTIQ) 100 MG 24 hr tablet; TAKE 1 TABLET(100 MG) BY MOUTH DAILY AFTER AND BREAKFAST -     lisdexamfetamine (VYVANSE) 30 MG capsule; Take 1 capsule (30 mg total) by mouth daily after breakfast. -     lisdexamfetamine (VYVANSE) 30 MG capsule; Take 1 capsule (30 mg total) by mouth daily. -     lisdexamfetamine (VYVANSE) 30 MG capsule; Take 1 capsule (30 mg total) by mouth daily.  Other obsessive-compulsive disorder -     desvenlafaxine (PRISTIQ) 100 MG 24 hr tablet; TAKE 1 TABLET(100 MG) BY MOUTH DAILY AFTER AND BREAKFAST    Please see After Visit Summary for patient specific instructions.  Future Appointments  Date Time Provider [place]  06/28/2021 11:30 AM Thayer Headings, PMHNP [facility] None    No orders of the defined types were placed in this encounter.   -------------------------------

## 2021-05-19 ENCOUNTER — Ambulatory Visit: Admit: 2021-05-19 | Payer: 59 | Admitting: Otolaryngology

## 2021-05-19 SURGERY — SEPTOPLASTY, NOSE, WITH NASAL TURBINATE REDUCTION
Anesthesia: General | Laterality: Bilateral

## 2021-06-28 ENCOUNTER — Other Ambulatory Visit: Payer: Self-pay

## 2021-06-28 ENCOUNTER — Encounter: Payer: Self-pay | Admitting: Psychiatry

## 2021-06-28 ENCOUNTER — Ambulatory Visit (INDEPENDENT_AMBULATORY_CARE_PROVIDER_SITE_OTHER): Payer: 59 | Admitting: Psychiatry

## 2021-06-28 DIAGNOSIS — F5105 Insomnia due to other mental disorder: Secondary | ICD-10-CM

## 2021-06-28 DIAGNOSIS — F428 Other obsessive-compulsive disorder: Secondary | ICD-10-CM | POA: Diagnosis not present

## 2021-06-28 DIAGNOSIS — F331 Major depressive disorder, recurrent, moderate: Secondary | ICD-10-CM | POA: Diagnosis not present

## 2021-06-28 DIAGNOSIS — F411 Generalized anxiety disorder: Secondary | ICD-10-CM

## 2021-06-28 DIAGNOSIS — F5081 Binge eating disorder: Secondary | ICD-10-CM

## 2021-06-28 MED ORDER — BUSPIRONE HCL 30 MG PO TABS: ORAL_TABLET | ORAL | 0 refills | Status: DC

## 2021-06-28 MED ORDER — LISDEXAMFETAMINE DIMESYLATE 30 MG PO CAPS: 30.0000 mg | ORAL_CAPSULE | Freq: Every day | ORAL | 0 refills | Status: DC

## 2021-06-28 MED ORDER — METFORMIN HCL 500 MG PO TABS: ORAL_TABLET | ORAL | 2 refills | Status: DC

## 2021-06-28 MED ORDER — GABAPENTIN 300 MG PO CAPS: 300.0000 mg | ORAL_CAPSULE | Freq: Three times a day (TID) | ORAL | 0 refills | Status: DC

## 2021-06-28 MED ORDER — ZOLPIDEM TARTRATE ER 12.5 MG PO TBCR: 12.5000 mg | EXTENDED_RELEASE_TABLET | Freq: Every evening | ORAL | 2 refills | Status: DC | PRN

## 2021-06-28 MED ORDER — DESVENLAFAXINE SUCCINATE ER 100 MG PO TB24: ORAL_TABLET | ORAL | 0 refills | Status: DC

## 2021-06-28 NOTE — Progress Notes (Signed)
Mario Briggs [id number] 07/03/1971 50 y.o.  Subjective:   Patient ID:  Mario Briggs is a 50 y.o. (DOB Jul 04, 1971) male.  Chief Complaint:  Chief Complaint  Patient presents with   Follow-up    Anxiety, depression, insomnia, binge eating     HPI [name] presents to the office today for follow-up of depression, anxiety, insomnia, and binge eating. He is now working day shift. He reports that he is not eating excessively at night. He reports, "I think I feel better" since change to day shift and Ambien CR. Wife reports that he will doze off during the day if sitting still. Never had a sleep study. His wife reports that she has observed him snoring, stopping breathing, and gasping. Epworth Sleepiness Scale of 9. Does not know neck size.   He reports that depression has been ok. Has some anxiety when work is slow due to anticipating his next call. Wife reports that he will frequently change his schedule when it is slow. He reports occ panic s/s. Occ taking Xanax prn. Wife reports that he has occasional irritability and this is mild. He reports concentration is adequate and scores at work have improved. Denies SI.   Has not been playing as many computer games. Has been enjoying TV and spending time with his wife.   "I wish I had more energy."   He reports, "I'm probably the heaviest I have ever been." Contemplating seeing a bariatric provider in the upcoming year.   Past Psychiatric Medication Trials: Viibryd-Felt that it may have become less effective over time. Pristiq Clomipramine Wellbutrin XL Vraylar Rexulti Latuda Xanax Ambien Ambien CR Trazodone Buspar-Initially helpful Vyvanse- Somewhat helpful for concentration and energy. May cause some restlessness. Gabapentin  AIMS    Flowsheet Row Office Visit from 03/29/2021 in [facility] Visit from 01/19/2021 in [facility]  AIMS Total Score 0 0        Review of Systems:  Review  of Systems  Cardiovascular:  Negative for palpitations.  Musculoskeletal:  Negative for gait problem.  Neurological:  Negative for tremors.  Psychiatric/Behavioral:         Please refer to HPI   Medications: I have reviewed the patient's current medications.  Current Outpatient Medications  Medication Sig Dispense Refill   ALPRAZolam (XANAX) 1 MG tablet TAKE 1 TABLET(1 MG) BY MOUTH FOUR TIMES DAILY AS NEEDED FOR ANXIETY 360 tablet 1   brexpiprazole (REXULTI) 2 MG TABS tablet Take 1 tablet (2 mg total) by mouth daily. 90 tablet 1   cholecalciferol (VITAMIN D3) 25 MCG (1000 UNIT) tablet Take 1,000 Units by mouth 2 (two) times daily.     finasteride (PROSCAR) 5 MG tablet Take 5 mg by mouth.     fluticasone (FLONASE) 50 MCG/ACT nasal spray SMARTSIG:1-2 Spray(s) Both Nares Daily     loratadine (CLARITIN) 10 MG tablet Take 10 mg by mouth daily as needed for allergies.     metFORMIN (GLUCOPHAGE) 500 MG tablet Take 1 tablet daily with a meal for one week, and then increase to 1 tablet twice daily with meals if tolerated 60 tablet 2   montelukast (SINGULAIR) 10 MG tablet Take 1 tablet by mouth daily.     Olopatadine HCl 0.6 % SOLN Place into both nostrils.     pseudoephedrine (SUDAFED) 30 MG tablet Take 30 mg by mouth every 4 (four) hours as needed for congestion.     Saw Palmetto, Serenoa repens, (SAW PALMETTO PO) Take by mouth.  tamsulosin (FLOMAX) 0.4 MG CAPS capsule Take 0.4 mg by mouth.     busPIRone (BUSPAR) 30 MG tablet TAKE 1 TABLET(30 MG) BY MOUTH TWICE DAILY 180 tablet 0   desvenlafaxine (PRISTIQ) 100 MG 24 hr tablet TAKE 1 TABLET(100 MG) BY MOUTH DAILY AFTER AND BREAKFAST 90 tablet 0   fexofenadine (ALLEGRA) 60 MG tablet Take 60 mg by mouth 2 (two) times daily.     gabapentin (NEURONTIN) 300 MG capsule Take 1 capsule (300 mg total) by mouth 3 (three) times daily. 270 capsule 0   [START ON 08/26/2021] lisdexamfetamine (VYVANSE) 30 MG capsule Take 1 capsule (30 mg total) by mouth daily.  30 capsule 0   [START ON 07/29/2021] lisdexamfetamine (VYVANSE) 30 MG capsule Take 1 capsule (30 mg total) by mouth daily after breakfast. 30 capsule 0   [START ON 07/01/2021] lisdexamfetamine (VYVANSE) 30 MG capsule Take 1 capsule (30 mg total) by mouth daily. 30 capsule 0   Pseudoephedrine-APAP-DM (DAYQUIL PO) Take by mouth. (Patient not taking: Reported on 01/05/2021)     tadalafil (CIALIS) 5 MG tablet Take 5 mg by mouth daily.     [START ON 07/21/2021] zolpidem (AMBIEN CR) 12.5 MG CR tablet Take 1 tablet (12.5 mg total) by mouth at bedtime as needed for sleep. 30 tablet 2   No current facility-administered medications for this visit.    Medication Side Effects: None  Allergies: No Known Allergies  History reviewed. No pertinent past medical history.  Past Medical History, Surgical history, Social history, and Family history were reviewed and updated as appropriate.   Please see review of systems for further details on the patient's review from today.   Objective:   Physical Exam:  BP 123/81   Pulse 96   Physical Exam Constitutional:      General: He is not in acute distress. Musculoskeletal:        General: No deformity.  Neurological:     Mental Status: He is alert and oriented to person, place, and time.     Coordination: Coordination normal.  Psychiatric:        Attention and Perception: Attention and perception normal. He does not perceive auditory or visual hallucinations.        Mood and Affect: Mood normal. Mood is not anxious or depressed. Affect is not labile, blunt, angry or inappropriate.        Speech: Speech normal.        Behavior: Behavior normal.        Thought Content: Thought content normal. Thought content is not paranoid or delusional. Thought content does not include homicidal or suicidal ideation. Thought content does not include homicidal or suicidal plan.        Cognition and Memory: Cognition and memory normal.        Judgment: Judgment normal.      Comments: Insight intact    Lab Review:     Component Value Date/Time   NA 142 03/03/2021 1339   K 4.7 03/03/2021 1339   CL 105 03/03/2021 1339   CO2 27 03/03/2021 1339   GLUCOSE 95 03/03/2021 1339   BUN 10 03/03/2021 1339   CREATININE 1.21 03/03/2021 1339   CALCIUM 9.7 03/03/2021 1339   PROT 6.8 03/03/2021 1339   AST 20 03/03/2021 1339   ALT 25 03/03/2021 1339   BILITOT 0.3 03/03/2021 1339    No results found for: WBC, RBC, HGB, HCT, PLT, MCV, MCH, MCHC, RDW, LYMPHSABS, MONOABS, EOSABS, BASOSABS  No results found for: POCLITH,  LITHIUM   No results found for: PHENYTOIN, PHENOBARB, VALPROATE, CBMZ   .res Assessment: Plan:   Pt seen for 30 minutes and time spent discussing s/s of sleep apnea and discussing negative effects sleep apnea can have on his health and potential benefits with treatment with cPap. Pt agrees to home sleep study. Discussed that atypical anti-psychotics can cause insulin resistance which can result in weight gain and difficulty losing weight. Discussed that some studies indicate that Metformin may be helpful with improving insulin resistance associated with atypical antipsychotics. Discussed potential benefits, risks, and side effects of Metformin and pt agrees to trial of Metformin for off-label indication for antipsychotic induced insulin resistance. Will start Metformin 500 mg daily with a meal for one week, then increase to 500 mg twice daily with meals.  Reviewed other possible metabolic side effects with Rexulti. Continue Rexulti 2 mg po qd for depression.  Continue Buspar 30 mg po BID for anxiety.  Continue Pristiq 100 mg po qd for depression and anxiety.  Continue Xanax 1 mg po 4 times daily as needed for anxiety.  Continue Gabapentin 300 mg po TID for anxiety.  Continue Vyvanse 30 mg po qd for binge eating d/o. Continue Ambien CR 12.5 mg po QHS for insomnia.  Pt to follow-up in 3 months or sooner if clinically indicated.  Patient advised to contact  office with any questions, adverse effects, or acute worsening in signs and symptoms.   Salil was seen today for follow-up.  Diagnoses and all orders for this visit:  Generalized anxiety disorder -     busPIRone (BUSPAR) 30 MG tablet; TAKE 1 TABLET(30 MG) BY MOUTH TWICE DAILY -     desvenlafaxine (PRISTIQ) 100 MG 24 hr tablet; TAKE 1 TABLET(100 MG) BY MOUTH DAILY AFTER AND BREAKFAST -     gabapentin (NEURONTIN) 300 MG capsule; Take 1 capsule (300 mg total) by mouth 3 (three) times daily.  Moderate recurrent major depression (HCC) -     desvenlafaxine (PRISTIQ) 100 MG 24 hr tablet; TAKE 1 TABLET(100 MG) BY MOUTH DAILY AFTER AND BREAKFAST -     lisdexamfetamine (VYVANSE) 30 MG capsule; Take 1 capsule (30 mg total) by mouth daily after breakfast.  Binge eating disorder -     desvenlafaxine (PRISTIQ) 100 MG 24 hr tablet; TAKE 1 TABLET(100 MG) BY MOUTH DAILY AFTER AND BREAKFAST -     lisdexamfetamine (VYVANSE) 30 MG capsule; Take 1 capsule (30 mg total) by mouth daily. -     lisdexamfetamine (VYVANSE) 30 MG capsule; Take 1 capsule (30 mg total) by mouth daily after breakfast. -     lisdexamfetamine (VYVANSE) 30 MG capsule; Take 1 capsule (30 mg total) by mouth daily.  Other obsessive-compulsive disorder -     desvenlafaxine (PRISTIQ) 100 MG 24 hr tablet; TAKE 1 TABLET(100 MG) BY MOUTH DAILY AFTER AND BREAKFAST  Insomnia disorder, with non-sleep disorder mental comorbidity, persistent -     zolpidem (AMBIEN CR) 12.5 MG CR tablet; Take 1 tablet (12.5 mg total) by mouth at bedtime as needed for sleep.  Other orders -     metFORMIN (GLUCOPHAGE) 500 MG tablet; Take 1 tablet daily with a meal for one week, and then increase to 1 tablet twice daily with meals if tolerated    Please see After Visit Summary for patient specific instructions.  Future Appointments  Date Time Provider [place]  09/27/2021 11:30 AM Thayer Headings, PMHNP [facility] None     No orders of the defined types  were  placed in this encounter.   -------------------------------

## 2021-08-03 ENCOUNTER — Telehealth: Payer: Self-pay | Admitting: Psychiatry

## 2021-08-03 NOTE — Telephone Encounter (Signed)
Received results from home sleep study test, which indicate severe sleep apnea. Contacted pt to discuss results and that a cPap machine is recommended. Pt reports that he would like to start treatment with a cPap machine, however he is concerned about his deductible and insurance coverage with the start of the new year. He requested that provider hold off on sending order for cPap machine until he can get clarification re: insurance and/or meet deductible. Will wait until pt contacts office before sending order for cPap to [facility].

## 2021-09-27 ENCOUNTER — Other Ambulatory Visit: Payer: Self-pay

## 2021-09-27 ENCOUNTER — Encounter: Payer: Self-pay | Admitting: Psychiatry

## 2021-09-27 ENCOUNTER — Ambulatory Visit (INDEPENDENT_AMBULATORY_CARE_PROVIDER_SITE_OTHER): Payer: 59 | Admitting: Psychiatry

## 2021-09-27 VITALS — BP 128/76 | HR 87

## 2021-09-27 DIAGNOSIS — F5105 Insomnia due to other mental disorder: Secondary | ICD-10-CM

## 2021-09-27 DIAGNOSIS — F5081 Binge eating disorder: Secondary | ICD-10-CM

## 2021-09-27 DIAGNOSIS — F330 Major depressive disorder, recurrent, mild: Secondary | ICD-10-CM | POA: Diagnosis not present

## 2021-09-27 DIAGNOSIS — F411 Generalized anxiety disorder: Secondary | ICD-10-CM | POA: Diagnosis not present

## 2021-09-27 DIAGNOSIS — F428 Other obsessive-compulsive disorder: Secondary | ICD-10-CM

## 2021-09-27 MED ORDER — GABAPENTIN 300 MG PO CAPS: 300.0000 mg | ORAL_CAPSULE | Freq: Three times a day (TID) | ORAL | 1 refills | Status: DC

## 2021-09-27 MED ORDER — ZOLPIDEM TARTRATE ER 12.5 MG PO TBCR: 12.5000 mg | EXTENDED_RELEASE_TABLET | Freq: Every evening | ORAL | 5 refills | Status: DC | PRN

## 2021-09-27 MED ORDER — BREXPIPRAZOLE 2 MG PO TABS: 2.0000 mg | ORAL_TABLET | Freq: Every day | ORAL | 1 refills | Status: DC

## 2021-09-27 MED ORDER — ALPRAZOLAM 1 MG PO TABS: ORAL_TABLET | ORAL | 1 refills | Status: DC

## 2021-09-27 MED ORDER — DESVENLAFAXINE SUCCINATE ER 100 MG PO TB24: ORAL_TABLET | ORAL | 1 refills | Status: DC

## 2021-09-27 MED ORDER — BUSPIRONE HCL 30 MG PO TABS: ORAL_TABLET | ORAL | 1 refills | Status: DC

## 2021-09-27 MED ORDER — LISDEXAMFETAMINE DIMESYLATE 30 MG PO CAPS: 30.0000 mg | ORAL_CAPSULE | Freq: Every day | ORAL | 0 refills | Status: DC

## 2021-09-27 NOTE — Progress Notes (Signed)
Mario Briggs [id number] Mar 10, 1971 51 y.o.  Subjective:   Patient ID:  Mario Briggs is a 51 y.o. (DOB [birth year]) male.  Chief Complaint:  Chief Complaint  Patient presents with   Anxiety   Depression     Mario Briggs presents to the office today for follow-up of anxiety, depression, binge eating, and insomnia. He is accompanied by his wife.   He reports that he does not think Metformin has been helpful. He reports that his weight has not not changed. He does not think he has gained weight with Rexulti.   Denies sad mood. He reports that his motivation has been low. He has been off the last week and "gotten very little done." He reports that his energy is low. Has not been as interested in things and is no longer playing games on his iPad. He reports some feelings of guilt- "like I'm not pulling my own weight."   He reports that he has been having increased anxiety. He will change his schedule due to anxiety and feels "I just can't do it." He will use a day off when he feels unable to work and then has to pick up another shift that would usually be his day off. He reports anxiety when waiting for the next call and not knowing what it might be. He reports anxiety is less when he is busier and has less time for anticipatory anxiety. He reports that he may be having panic attacks when unable to work. He reports that most of his anxiety is work-related. 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   He reports that he has been sleeping well. He reports occ waking up with a backache. He reports that he did not fall asleep for 3-4 hours one night when he forgot to take Ambien. He reports that his appetite has been good. He reports that he has been snacking less at night since he moved to day shift. Concentration and focus has been ok. Denies SI.   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   Vyvanse last filled 2/13 Ambien CR last filled 1/30 Alprazolam last filled  11/30  Past Psychiatric Medication Trials: Viibryd-Felt that it may have become less effective over time. Pristiq Clomipramine Wellbutrin XL Vraylar Rexulti Latuda Xanax Ambien Ambien CR Trazodone Buspar-Initially helpful Vyvanse- Somewhat helpful for concentration and energy. May cause some restlessness. Gabapentin  AIMS    Flowsheet Row Office Visit from 09/27/2021 in [facility] [place] Visit from 03/29/2021 in [place] Visit from 01/19/2021 in [place] Total Score 0 0 0        Review of Systems:  Review of Systems  Musculoskeletal:  Positive for back pain. Negative for gait problem.       He reports possible plantar fascitis.   Neurological:  Negative for tremors.  Psychiatric/Behavioral:         Please refer to HPI   He reports that his PCP has started him on Lisinopril.   Medications: I have reviewed the patient's current medications.  Current Outpatient Medications  Medication Sig Dispense Refill   Aspirin Effervescent (ALKA-SELTZER PO) Take by mouth.     fexofenadine (ALLEGRA) 60 MG tablet Take 60 mg by mouth 2 (two) times daily.     Pseudoephedrine-APAP-DM (DAYQUIL PO) Take by mouth.     ALPRAZolam (XANAX) 1 MG tablet TAKE 1 TABLET(1 MG) BY MOUTH FOUR TIMES DAILY AS NEEDED FOR ANXIETY 360 tablet 1   brexpiprazole (  REXULTI) 2 MG TABS tablet Take 1 tablet (2 mg total) by mouth daily. 90 tablet 1   busPIRone (BUSPAR) 30 MG tablet TAKE 1 TABLET(30 MG) BY MOUTH TWICE DAILY 180 tablet 1   cholecalciferol (VITAMIN D3) 25 MCG (1000 UNIT) tablet Take 1,000 Units by mouth 2 (two) times daily.     desvenlafaxine (PRISTIQ) 100 MG 24 hr tablet TAKE 1 TABLET(100 MG) BY MOUTH DAILY AFTER AND BREAKFAST 90 tablet 1   finasteride (PROSCAR) 5 MG tablet Take 5 mg by mouth.     fluticasone (FLONASE) 50 MCG/ACT nasal spray SMARTSIG:1-2 Spray(s) Both Nares Daily     gabapentin (NEURONTIN) 300 MG capsule Take 1  capsule (300 mg total) by mouth 3 (three) times daily. 270 capsule 1   [START ON 12/06/2021] lisdexamfetamine (VYVANSE) 30 MG capsule Take 1 capsule (30 mg total) by mouth daily. 30 capsule 0   [START ON 11/08/2021] lisdexamfetamine (VYVANSE) 30 MG capsule Take 1 capsule (30 mg total) by mouth daily after breakfast. 30 capsule 0   [START ON 10/11/2021] lisdexamfetamine (VYVANSE) 30 MG capsule Take 1 capsule (30 mg total) by mouth daily. 30 capsule 0   lisinopril (ZESTRIL) 10 MG tablet Take 10 mg by mouth daily.     loratadine (CLARITIN) 10 MG tablet Take 10 mg by mouth daily as needed for allergies.     montelukast (SINGULAIR) 10 MG tablet Take 1 tablet by mouth daily.     Olopatadine HCl 0.6 % SOLN Place into both nostrils.     pseudoephedrine (SUDAFED) 30 MG tablet Take 30 mg by mouth every 4 (four) hours as needed for congestion.     Saw Palmetto, Serenoa repens, (SAW PALMETTO PO) Take by mouth.     tadalafil (CIALIS) 5 MG tablet Take 5 mg by mouth daily.     tamsulosin (FLOMAX) 0.4 MG CAPS capsule Take 0.4 mg by mouth.     zolpidem (AMBIEN CR) 12.5 MG CR tablet Take 1 tablet (12.5 mg total) by mouth at bedtime as needed for sleep. 30 tablet 5   No current facility-administered medications for this visit.    Medication Side Effects: None  Allergies: No Known Allergies  History reviewed. No pertinent past medical history.  Past Medical History, Surgical history, Social history, and Family history were reviewed and updated as appropriate.   Please see review of systems for further details on the patient's review from today.   Objective:   Physical Exam:  BP 128/76    Pulse 87   Physical Exam Constitutional:      General: He is not in acute distress. Musculoskeletal:        General: No deformity.  Neurological:     Mental Status: He is alert and oriented to person, place, and time.     Coordination: Coordination normal.  Psychiatric:        Attention and Perception: Attention and  perception normal. He does not perceive auditory or visual hallucinations.        Mood and Affect: Mood is anxious. Mood is not depressed. Affect is not labile, blunt, angry or inappropriate.        Speech: Speech normal.        Behavior: Behavior normal.        Thought Content: Thought content normal. Thought content is not paranoid or delusional. Thought content does not include homicidal or suicidal ideation. Thought content does not include homicidal or suicidal plan.        Cognition and Memory: Cognition  and memory normal.        Judgment: Judgment normal.     Comments: Insight intact    Lab Review:     Component Value Date/Time   NA 142 03/03/2021 1339   K 4.7 03/03/2021 1339   CL 105 03/03/2021 1339   CO2 27 03/03/2021 1339   GLUCOSE 95 03/03/2021 1339   BUN 10 03/03/2021 1339   CREATININE 1.21 03/03/2021 1339   CALCIUM 9.7 03/03/2021 1339   PROT 6.8 03/03/2021 1339   AST 20 03/03/2021 1339   ALT 25 03/03/2021 1339   BILITOT 0.3 03/03/2021 1339    No results found for: WBC, RBC, HGB, HCT, PLT, MCV, MCH, MCHC, RDW, LYMPHSABS, MONOABS, EOSABS, BASOSABS  No results found for: POCLITH, LITHIUM   No results found for: PHENYTOIN, PHENOBARB, VALPROATE, CBMZ   .res Assessment: Plan:   Pt seen for 30 minutes and time spent reviewing treatment plan. He reports that he would now like to proceed with trying to obtain a cPap since his deductible has been met.  Will send order for cPap to [facility].  Will discontinue metformin since he reports that he has not noticed any significant benefit since starting Metformin. Continue Rexulti 2 mg po qd for depression. Continue Buspar 30 mg po BID for anxiety.  Continue Xanax 1 mg four times daily as needed for anxiety.  Continue Pristiq 100 mg po qd for depression and anxiety.  Continue Vyvanse 30 mg po qd for binge eating disorder.   Orris was seen today for anxiety and depression.  Diagnoses and all orders for this visit:  Mild  episode of recurrent major depressive disorder (HCC) -     desvenlafaxine (PRISTIQ) 100 MG 24 hr tablet; TAKE 1 TABLET(100 MG) BY MOUTH DAILY AFTER AND BREAKFAST -     lisdexamfetamine (VYVANSE) 30 MG capsule; Take 1 capsule (30 mg total) by mouth daily after breakfast. -     brexpiprazole (REXULTI) 2 MG TABS tablet; Take 1 tablet (2 mg total) by mouth daily.  Generalized anxiety disorder -     ALPRAZolam (XANAX) 1 MG tablet; TAKE 1 TABLET(1 MG) BY MOUTH FOUR TIMES DAILY AS NEEDED FOR ANXIETY -     busPIRone (BUSPAR) 30 MG tablet; TAKE 1 TABLET(30 MG) BY MOUTH TWICE DAILY -     desvenlafaxine (PRISTIQ) 100 MG 24 hr tablet; TAKE 1 TABLET(100 MG) BY MOUTH DAILY AFTER AND BREAKFAST -     gabapentin (NEURONTIN) 300 MG capsule; Take 1 capsule (300 mg total) by mouth 3 (three) times daily.  Insomnia disorder, with non-sleep disorder mental comorbidity, persistent -     ALPRAZolam (XANAX) 1 MG tablet; TAKE 1 TABLET(1 MG) BY MOUTH FOUR TIMES DAILY AS NEEDED FOR ANXIETY -     zolpidem (AMBIEN CR) 12.5 MG CR tablet; Take 1 tablet (12.5 mg total) by mouth at bedtime as needed for sleep.  Binge eating disorder -     desvenlafaxine (PRISTIQ) 100 MG 24 hr tablet; TAKE 1 TABLET(100 MG) BY MOUTH DAILY AFTER AND BREAKFAST -     lisdexamfetamine (VYVANSE) 30 MG capsule; Take 1 capsule (30 mg total) by mouth daily. -     lisdexamfetamine (VYVANSE) 30 MG capsule; Take 1 capsule (30 mg total) by mouth daily after breakfast. -     lisdexamfetamine (VYVANSE) 30 MG capsule; Take 1 capsule (30 mg total) by mouth daily.  Other obsessive-compulsive disorder -     desvenlafaxine (PRISTIQ) 100 MG 24 hr tablet; TAKE 1 TABLET(100 MG)  BY MOUTH DAILY AFTER AND BREAKFAST     Please see After Visit Summary for patient specific instructions.  Future Appointments  Date Time Provider [place]  12/20/2021 11:30 AM Thayer Headings, PMHNP CP-CP None    No orders of the defined types were placed in this  encounter.   -------------------------------

## 2021-12-20 ENCOUNTER — Ambulatory Visit: Payer: 59 | Admitting: Psychiatry

## 2021-12-28 ENCOUNTER — Ambulatory Visit: Payer: 59 | Admitting: Psychiatry

## 2022-01-21 ENCOUNTER — Ambulatory Visit (INDEPENDENT_AMBULATORY_CARE_PROVIDER_SITE_OTHER): Payer: 59 | Admitting: Psychiatry

## 2022-01-21 ENCOUNTER — Encounter: Payer: Self-pay | Admitting: Psychiatry

## 2022-01-21 VITALS — BP 118/70 | HR 85

## 2022-01-21 DIAGNOSIS — F331 Major depressive disorder, recurrent, moderate: Secondary | ICD-10-CM

## 2022-01-21 DIAGNOSIS — F5081 Binge eating disorder: Secondary | ICD-10-CM

## 2022-01-21 DIAGNOSIS — F5105 Insomnia due to other mental disorder: Secondary | ICD-10-CM | POA: Diagnosis not present

## 2022-01-21 DIAGNOSIS — F411 Generalized anxiety disorder: Secondary | ICD-10-CM | POA: Diagnosis not present

## 2022-01-21 DIAGNOSIS — F428 Other obsessive-compulsive disorder: Secondary | ICD-10-CM

## 2022-01-21 MED ORDER — LISDEXAMFETAMINE DIMESYLATE 30 MG PO CAPS: 30.0000 mg | ORAL_CAPSULE | Freq: Every day | ORAL | 0 refills | Status: DC

## 2022-01-21 MED ORDER — ALPRAZOLAM 1 MG PO TABS: ORAL_TABLET | ORAL | 1 refills | Status: DC

## 2022-01-21 MED ORDER — BUSPIRONE HCL 30 MG PO TABS: ORAL_TABLET | ORAL | 1 refills | Status: DC

## 2022-01-21 MED ORDER — DESVENLAFAXINE SUCCINATE ER 100 MG PO TB24: ORAL_TABLET | ORAL | 1 refills | Status: DC

## 2022-01-21 MED ORDER — BREXPIPRAZOLE 3 MG PO TABS: 3.0000 mg | ORAL_TABLET | Freq: Every day | ORAL | 1 refills | Status: DC

## 2022-01-21 MED ORDER — GABAPENTIN 300 MG PO CAPS: 300.0000 mg | ORAL_CAPSULE | Freq: Three times a day (TID) | ORAL | 1 refills | Status: DC

## 2022-02-25 ENCOUNTER — Ambulatory Visit (INDEPENDENT_AMBULATORY_CARE_PROVIDER_SITE_OTHER): Payer: 59 | Admitting: Psychiatry

## 2022-02-25 ENCOUNTER — Encounter: Payer: Self-pay | Admitting: Psychiatry

## 2022-02-25 DIAGNOSIS — F5105 Insomnia due to other mental disorder: Secondary | ICD-10-CM

## 2022-02-25 DIAGNOSIS — F331 Major depressive disorder, recurrent, moderate: Secondary | ICD-10-CM

## 2022-02-25 DIAGNOSIS — F411 Generalized anxiety disorder: Secondary | ICD-10-CM | POA: Diagnosis not present

## 2022-02-25 MED ORDER — REXULTI 3 MG PO TABS: 3.0000 mg | ORAL_TABLET | Freq: Every day | ORAL | 1 refills | Status: DC

## 2022-02-25 MED ORDER — ZOLPIDEM TARTRATE ER 12.5 MG PO TBCR: 12.5000 mg | EXTENDED_RELEASE_TABLET | Freq: Every evening | ORAL | 5 refills | Status: DC | PRN

## 2022-02-25 NOTE — Progress Notes (Unsigned)
Sigurd Pugh [id number] July 03, 1971 51 y.o.  Subjective:   Patient ID:  Mario Briggs is a 51 y.o. (DOB 01/08/1971) male.  Chief Complaint:  Chief Complaint  Patient presents with   Anxiety    HPI Mario Briggs presents to the office today for follow-up of anxiety, depression, insomnia, and sleep apnea.   He reports that he has been able to start cPap machine. He reports some adjustment to cPap. He reports that he has been wearing it every night. He has been taking it off during the night because it gets wet. He estimates wearing it about 6 hours a night. He reports that he has not noticed any changes since he started wearing the cPap. Wife reports that he is no longer snoring at night when he wears his cPap and that he continues to snore if he falls asleep while napping without cPap.   Denies any change since increase in Rexulti to 3 mg daily. He reports, "thinks are pretty good right now. I feel stable other than the anxiety." He reports that he has been using OTC meds to treat symptoms of anxiety, alka-seltzer and pepto. He reports that he has occasional panic attacks, usually when it is almost time to start work. He has continued to chose not to work certain days due to anxiety and will work on his day off instead. Some anxiety at work with change in metrics. He reports chronic worry.   He reports that he has "bad days and good days." Wife notices that his mood will abruptly change at times. Energy has been low. Motivation is low. Some slight difficulty falling and staying asleep. Concentration has been ok. Denies SI.   He reports "I'm mad at myself" for gaining weight. He reports that he has eliminated many unhealthy foods. He reports that he thinks increasing activity would be helpful and that it is difficult to get started.   Wife is retired. He reports that he worries about his wife's health. He continues to not have contact with his mother.   Ambien last filled 02/10/22.   Past  Psychiatric Medication Trials: Viibryd-Felt that it may have become less effective over time. Pristiq Clomipramine Wellbutrin XL Vraylar Rexulti Latuda Xanax Ambien Ambien CR Trazodone Buspar-Initially helpful Vyvanse- Somewhat helpful for concentration and energy. May cause some restlessness. Gabapentin  AIMS    Flowsheet Row Office Visit from 01/21/2022 in [place] Visit from 09/27/2021 in [facility] Visit from 03/29/2021 in [place] Visit from 01/19/2021 in [place] Total Score 0 0 0 0        Review of Systems:  Review of Systems  Gastrointestinal:  Positive for diarrhea.       Reports diarrhea for several months.   Musculoskeletal:  Positive for back pain. Negative for gait problem.  Psychiatric/Behavioral:         Please refer to HPI    Medications: I have reviewed the patient's current medications.  Current Outpatient Medications  Medication Sig Dispense Refill   [START ON 03/22/2022] ALPRAZolam (XANAX) 1 MG tablet TAKE 1 TABLET(1 MG) BY MOUTH FOUR TIMES DAILY AS NEEDED FOR ANXIETY 360 tablet 1   Aspirin Effervescent (ALKA-SELTZER PO) Take by mouth.     bismuth subsalicylate (PEPTO BISMOL) 262 MG/15ML suspension Take 30 mLs by mouth every 6 (six) hours as needed.     busPIRone (BUSPAR) 30 MG tablet TAKE 1 TABLET(30 MG) BY MOUTH TWICE DAILY 180 tablet 1  cholecalciferol (VITAMIN D3) 25 MCG (1000 UNIT) tablet Take 1,000 Units by mouth 2 (two) times daily.     desvenlafaxine (PRISTIQ) 100 MG 24 hr tablet TAKE 1 TABLET(100 MG) BY MOUTH DAILY AFTER AND BREAKFAST 90 tablet 1   fexofenadine (ALLEGRA) 60 MG tablet Take 60 mg by mouth daily.     finasteride (PROSCAR) 5 MG tablet Take 5 mg by mouth.     fluticasone (FLONASE) 50 MCG/ACT nasal spray SMARTSIG:1-2 Spray(s) Both Nares Daily     gabapentin (NEURONTIN) 300 MG capsule Take 1 capsule (300 mg total) by mouth 3 (three)  times daily. 270 capsule 1   [START ON 04/15/2022] lisdexamfetamine (VYVANSE) 30 MG capsule Take 1 capsule (30 mg total) by mouth daily. 30 capsule 0   lisinopril (ZESTRIL) 10 MG tablet Take 10 mg by mouth daily.     loratadine (CLARITIN) 10 MG tablet Take 10 mg by mouth daily as needed for allergies.     montelukast (SINGULAIR) 10 MG tablet Take 1 tablet by mouth daily.     Olopatadine HCl 0.6 % SOLN Place into both nostrils.     tadalafil (CIALIS) 5 MG tablet Take 5 mg by mouth daily.     tamsulosin (FLOMAX) 0.4 MG CAPS capsule Take 0.4 mg by mouth.     Brexpiprazole (REXULTI) 3 MG TABS Take 1 tablet (3 mg total) by mouth daily. 90 tablet 1   [START ON 03/18/2022] lisdexamfetamine (VYVANSE) 30 MG capsule Take 1 capsule (30 mg total) by mouth daily after breakfast. 30 capsule 0   lisdexamfetamine (VYVANSE) 30 MG capsule Take 1 capsule (30 mg total) by mouth daily. 30 capsule 0   pseudoephedrine (SUDAFED) 30 MG tablet Take 30 mg by mouth every 4 (four) hours as needed for congestion. (Patient not taking: Reported on 01/21/2022)     [START ON 04/07/2022] zolpidem (AMBIEN CR) 12.5 MG CR tablet Take 1 tablet (12.5 mg total) by mouth at bedtime as needed for sleep. 30 tablet 5   No current facility-administered medications for this visit.    Medication Side Effects: None  Allergies: No Known Allergies  History reviewed. No pertinent past medical history.  Past Medical History, Surgical history, Social history, and Family history were reviewed and updated as appropriate.   Please see review of systems for further details on the patient's review from today.   Objective:   Physical Exam:  There were no vitals taken for this visit.  Physical Exam Constitutional:      General: He is not in acute distress. Musculoskeletal:        General: No deformity.  Neurological:     Mental Status: He is alert and oriented to person, place, and time.     Coordination: Coordination normal.  Psychiatric:         Attention and Perception: Attention and perception normal. He does not perceive auditory or visual hallucinations.        Mood and Affect: Mood is anxious. Affect is not labile, blunt, angry or inappropriate.        Speech: Speech normal.        Behavior: Behavior normal.        Thought Content: Thought content normal. Thought content is not paranoid or delusional. Thought content does not include homicidal or suicidal ideation. Thought content does not include homicidal or suicidal plan.        Cognition and Memory: Cognition and memory normal.        Judgment: Judgment normal.  Comments: Insight intact Mood presents as less depressed compared to last visit     Lab Review:     Component Value Date/Time   NA 142 03/03/2021 1339   K 4.7 03/03/2021 1339   CL 105 03/03/2021 1339   CO2 27 03/03/2021 1339   GLUCOSE 95 03/03/2021 1339   BUN 10 03/03/2021 1339   CREATININE 1.21 03/03/2021 1339   CALCIUM 9.7 03/03/2021 1339   PROT 6.8 03/03/2021 1339   AST 20 03/03/2021 1339   ALT 25 03/03/2021 1339   BILITOT 0.3 03/03/2021 1339    No results found for: "WBC", "RBC", "HGB", "HCT", "PLT", "MCV", "MCH", "MCHC", "RDW", "LYMPHSABS", "MONOABS", "EOSABS", "BASOSABS"  No results found for: "POCLITH", "LITHIUM"   No results found for: "PHENYTOIN", "PHENOBARB", "VALPROATE", "CBMZ"   .res Assessment: Plan:   Pt seen for 30 minutes and time spent discussing response to increase in Rexulti to 3 mg po qd. He reports that he has been tolerating increase without difficulty. He reports that it has been a difficult month to assess response to med change. Discussed that his mood presents as less depressed compared to last visit. He reports that he would like to continue current medications without changes to allow more time to monitor response to current medications.  Discussed adjusting to cPap and recommended that he contact [employer] [facility] about moisture inside of mask during the night in case  this could be due to humidified air and may be adjusted.  Continue Rexulti 3 mg po qd for mood.  Continue Vyvanse 30 mg p qd for binge eating and concentration.  Continue Pristiq 100 mg po qd for anxiety and depression.  Continue Xanax 1 mg four times daily as needed for anxiety.  Continue Gabapentin 300 mg TID for anxiety.  Continue Ambien CR 12.5 mg po QHS prn insomnia.  Pt to follow-up in 2 months or sooner if clinically indicated.  Patient advised to contact office with any questions, adverse effects, or acute worsening in signs and symptoms.   Clayson was seen today for anxiety.  Diagnoses and all orders for this visit:  Generalized anxiety disorder  Moderate episode of recurrent major depressive disorder (HCC) -     Brexpiprazole (REXULTI) 3 MG TABS; Take 1 tablet (3 mg total) by mouth daily.  Insomnia disorder, with non-sleep disorder mental comorbidity, persistent -     zolpidem (AMBIEN CR) 12.5 MG CR tablet; Take 1 tablet (12.5 mg total) by mouth at bedtime as needed for sleep.     Please see After Visit Summary for patient specific instructions.  Future Appointments  Date Time Provider [place]  04/27/2022 12:45 PM Thayer Headings, PMHNP [facility] None     No orders of the defined types were placed in this encounter.   -------------------------------

## 2022-04-27 ENCOUNTER — Encounter: Payer: Self-pay | Admitting: Psychiatry

## 2022-04-27 ENCOUNTER — Ambulatory Visit (INDEPENDENT_AMBULATORY_CARE_PROVIDER_SITE_OTHER): Payer: 59 | Admitting: Psychiatry

## 2022-04-27 VITALS — BP 121/76 | HR 91

## 2022-04-27 DIAGNOSIS — F331 Major depressive disorder, recurrent, moderate: Secondary | ICD-10-CM

## 2022-04-27 DIAGNOSIS — F5081 Binge eating disorder: Secondary | ICD-10-CM

## 2022-04-27 DIAGNOSIS — F411 Generalized anxiety disorder: Secondary | ICD-10-CM

## 2022-04-27 MED ORDER — LISDEXAMFETAMINE DIMESYLATE 30 MG PO CAPS: 30.0000 mg | ORAL_CAPSULE | Freq: Every day | ORAL | 0 refills | Status: DC

## 2022-04-27 MED ORDER — DESVENLAFAXINE SUCCINATE ER 50 MG PO TB24: 50.0000 mg | ORAL_TABLET | Freq: Every day | ORAL | 1 refills | Status: DC

## 2022-04-27 NOTE — Progress Notes (Signed)
Mario Briggs [id number] February 02, 1971 51 y.o.  Subjective:   Patient ID:  Mario Briggs is a 51 y.o. (DOB 11-Aug-1970) male.  Chief Complaint:  Chief Complaint  Patient presents with   Anxiety   Depression    Anxiety    Depression        Past medical history includes anxiety.    Mario Briggs presents to the office today for follow-up of anxiety, depression, and insomnia. He is accompanied by his wife. He reports that his anxiety has been the same or worse. He reports that his anxiety is work related. 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 He reports some panic attacks while working. He has been postponing work shifts to the weekend as a form of avoidance. He reports fears/negative beliefs that "I don't do a good job... you don't know what that next call is going to be" and how it will be rated. He reports that computer/AI rating says that his tone needs improvement and the amount of dead air. He reports that he had an error and if he has another in 6 months, it results in counseling. He reports performance base anxiety. He reports that metrics change and the importance/emphasis of certain metrics change often. He reports recent diarrhea, usually in the morning before work.   He reports sad mood. He reports that his motivation and energy have been low. He reports feelings of guilt about "not pulling my share." He reports that chores and tasks are piling up at home to include going through mail, shredding documents, doing the dishes. Wife reports that he sleeps excessively when he is not working. Wife reports that his "diet is worse than it has ever been." He reports that he has been eating frozen and processed foods. Wife reports that he has binged at times and this has improved slightly. He denies binging. Concentration has been ok. Denies SI.   He reports that he has tried therapists several times and "don't think I really got  a lot out of it."   He reports that he continues to have some difficulty adjusting to cPap. He reports that he has to reposition during the night due to back pain and when he turns on his side, air escapes. He estimates wearing it about 5 hours on average.   Ambien last filled 04/17/22 x 1  Vyvanse last filled 03/29/22 Alprazolam last filled 03/27/22 x 1   Past Psychiatric Medication Trials: Viibryd-Felt that it may have become less effective over time. Pristiq Clomipramine Wellbutrin XL Vraylar Rexulti Latuda Xanax Ambien Ambien CR Trazodone Buspar-Initially helpful Vyvanse- Somewhat helpful for concentration and energy. May cause some restlessness. Gabapentin  AIMS    Flowsheet Row Office Visit from 04/27/2022 in [facility] Visit from 01/21/2022 in [facility] Visit from 09/27/2021 in [facility] Visit from 03/29/2021 in [facility] [place] Visit from 01/19/2021 in [facility] Total Score 0 0 0 0 0        Review of Systems:  Review of Systems  Gastrointestinal:  Positive for abdominal pain and diarrhea.  Musculoskeletal:  Negative for gait problem.  Neurological:  Negative for tremors.  Psychiatric/Behavioral:  Positive for depression.        Please refer to HPI    Medications: I have reviewed the patient's current medications.  Current Outpatient Medications  Medication Sig Dispense Refill   ALPRAZolam (XANAX) 1  MG tablet TAKE 1 TABLET(1 MG) BY MOUTH FOUR TIMES DAILY AS NEEDED FOR ANXIETY 360 tablet 1   Aspirin Effervescent (ALKA-SELTZER PO) Take by mouth.     bismuth subsalicylate (PEPTO BISMOL) 262 MG/15ML suspension Take 30 mLs by mouth every 6 (six) hours as needed.     Brexpiprazole (REXULTI) 3 MG TABS Take 1 tablet (3 mg total) by mouth daily. 90 tablet 1   busPIRone (BUSPAR) 30 MG tablet TAKE 1 TABLET(30 MG) BY MOUTH TWICE DAILY 180 tablet 1    cholecalciferol (VITAMIN D3) 25 MCG (1000 UNIT) tablet Take 1,000 Units by mouth 2 (two) times daily.     desvenlafaxine (PRISTIQ) 50 MG 24 hr tablet Take 1 tablet (50 mg total) by mouth daily. Take with 100 mg tab to equal total dose of 150 mg 90 tablet 1   fexofenadine (ALLEGRA) 60 MG tablet Take 60 mg by mouth daily.     finasteride (PROSCAR) 5 MG tablet Take 5 mg by mouth.     fluticasone (FLONASE) 50 MCG/ACT nasal spray SMARTSIG:1-2 Spray(s) Both Nares Daily     gabapentin (NEURONTIN) 300 MG capsule Take 1 capsule (300 mg total) by mouth 3 (three) times daily. 270 capsule 1   lisdexamfetamine (VYVANSE) 30 MG capsule Take 1 capsule (30 mg total) by mouth daily. 30 capsule 0   [START ON 07/20/2022] lisdexamfetamine (VYVANSE) 30 MG capsule Take 1 capsule (30 mg total) by mouth daily. 30 capsule 0   lisinopril (ZESTRIL) 10 MG tablet Take 10 mg by mouth daily.     loratadine (CLARITIN) 10 MG tablet Take 10 mg by mouth daily as needed for allergies.     montelukast (SINGULAIR) 10 MG tablet Take 1 tablet by mouth daily.     psyllium (REGULOID) 0.52 g capsule Take 0.52 g by mouth daily.     tadalafil (CIALIS) 5 MG tablet Take 5 mg by mouth daily.     tamsulosin (FLOMAX) 0.4 MG CAPS capsule Take 0.4 mg by mouth.     zolpidem (AMBIEN CR) 12.5 MG CR tablet Take 1 tablet (12.5 mg total) by mouth at bedtime as needed for sleep. 30 tablet 5   desvenlafaxine (PRISTIQ) 100 MG 24 hr tablet TAKE 1 TABLET(100 MG) BY MOUTH DAILY AFTER AND BREAKFAST 90 tablet 1   [START ON 06/22/2022] lisdexamfetamine (VYVANSE) 30 MG capsule Take 1 capsule (30 mg total) by mouth daily. 30 capsule 0   [START ON 05/25/2022] lisdexamfetamine (VYVANSE) 30 MG capsule Take 1 capsule (30 mg total) by mouth daily after breakfast. 30 capsule 0   Olopatadine HCl 0.6 % SOLN Place into both nostrils. (Patient not taking: Reported on 04/27/2022)     pseudoephedrine (SUDAFED) 30 MG tablet Take 30 mg by mouth every 4 (four) hours as needed for  congestion. (Patient not taking: Reported on 01/21/2022)     No current facility-administered medications for this visit.    Medication Side Effects: None  Allergies: No Known Allergies  History reviewed. No pertinent past medical history.  Past Medical History, Surgical history, Social history, and Family history were reviewed and updated as appropriate.   Please see review of systems for further details on the patient's review from today.   Objective:   Physical Exam:  BP 121/76   Pulse 91   Physical Exam Constitutional:      General: He is not in acute distress. Musculoskeletal:        General: No deformity.  Neurological:     Mental Status: He is  alert and oriented to person, place, and time.     Coordination: Coordination normal.  Psychiatric:        Attention and Perception: Attention and perception normal. He does not perceive auditory or visual hallucinations.        Mood and Affect: Mood is anxious and depressed. Affect is not labile, blunt, angry or inappropriate.        Speech: Speech normal.        Behavior: Behavior normal.        Thought Content: Thought content normal. Thought content is not paranoid or delusional. Thought content does not include homicidal or suicidal ideation. Thought content does not include homicidal or suicidal plan.        Cognition and Memory: Cognition and memory normal.        Judgment: Judgment normal.     Comments: Insight intact     Lab Review:     Component Value Date/Time   NA 142 03/03/2021 1339   K 4.7 03/03/2021 1339   CL 105 03/03/2021 1339   CO2 27 03/03/2021 1339   GLUCOSE 95 03/03/2021 1339   BUN 10 03/03/2021 1339   CREATININE 1.21 03/03/2021 1339   CALCIUM 9.7 03/03/2021 1339   PROT 6.8 03/03/2021 1339   AST 20 03/03/2021 1339   ALT 25 03/03/2021 1339   BILITOT 0.3 03/03/2021 1339    No results found for: "WBC", "RBC", "HGB", "HCT", "PLT", "MCV", "MCH", "MCHC", "RDW", "LYMPHSABS", "MONOABS", "EOSABS",  "BASOSABS"  No results found for: "POCLITH", "LITHIUM"   No results found for: "PHENYTOIN", "PHENOBARB", "VALPROATE", "CBMZ"   .res Assessment: Plan:    Discussed possible treatment options for anxiety and depression. Pt reports that he does not want to reduce any of his currently medications since there seems to be some benefit with each of his current medications and he is concerned about possible worsening depression or anxiety with dose reduction. Discussed potential benefits, risks, and side effects of increasing Pristiq. Pt agrees to increase in Pristiq to 150 mg po qd to improve anxiety and depression.  Continue Vyvanse 30 mg po qd for binge eating disorder and concentration. Continue Rexulti 3 mg po qd for depression and anxiety.  Continue Buspar 30 mg po BID for anxiety.  Continue Gabapentin 300 mg po TID for anxiety.  Continue Alprazolam 1 mg po four times daily as needed for anxiety.  Continue Ambien CR 12.5 mg at bedtime for insomnia.  Pt to follow-up in 3 months or sooner if clinically indicated.  Patient advised to contact office with any questions, adverse effects, or acute worsening in signs and symptoms.   Mario Briggs was seen today for anxiety and depression.  Diagnoses and all orders for this visit:  Generalized anxiety disorder -     desvenlafaxine (PRISTIQ) 50 MG 24 hr tablet; Take 1 tablet (50 mg total) by mouth daily. Take with 100 mg tab to equal total dose of 150 mg  Binge eating disorder -     lisdexamfetamine (VYVANSE) 30 MG capsule; Take 1 capsule (30 mg total) by mouth daily. -     lisdexamfetamine (VYVANSE) 30 MG capsule; Take 1 capsule (30 mg total) by mouth daily after breakfast. -     lisdexamfetamine (VYVANSE) 30 MG capsule; Take 1 capsule (30 mg total) by mouth daily.  Moderate episode of recurrent major depressive disorder (HCC) -     desvenlafaxine (PRISTIQ) 50 MG 24 hr tablet; Take 1 tablet (50 mg total) by mouth daily. Take with 100  mg tab to equal  total dose of 150 mg     Please see After Visit Summary for patient specific instructions.  Future Appointments  Date Time Provider [place]  07/13/2022 11:30 AM Thayer Headings, PMHNP CP-CP None    No orders of the defined types were placed in this encounter.   -------------------------------

## 2022-04-29 ENCOUNTER — Other Ambulatory Visit: Payer: Self-pay

## 2022-04-29 ENCOUNTER — Telehealth: Payer: Self-pay | Admitting: Psychiatry

## 2022-04-29 DIAGNOSIS — F5081 Binge eating disorder: Secondary | ICD-10-CM

## 2022-04-29 MED ORDER — LISDEXAMFETAMINE DIMESYLATE 30 MG PO CAPS: 30.0000 mg | ORAL_CAPSULE | Freq: Every day | ORAL | 0 refills | Status: DC

## 2022-04-29 NOTE — Telephone Encounter (Signed)
Pended.

## 2022-04-29 NOTE — Telephone Encounter (Signed)
Pt LVM that pharmacy is seeing Vyvanse scripts from Oct, Nov and Dec, but not Sept (even though we show Sept.).  they are telling him he can't get med until Oct 25.  Pls see if script needs to be resent or or pharmacy contacted.  Next appt 12/13

## 2022-07-13 ENCOUNTER — Ambulatory Visit (INDEPENDENT_AMBULATORY_CARE_PROVIDER_SITE_OTHER): Payer: 59 | Admitting: Psychiatry

## 2022-07-13 ENCOUNTER — Encounter: Payer: Self-pay | Admitting: Psychiatry

## 2022-07-13 DIAGNOSIS — F5105 Insomnia due to other mental disorder: Secondary | ICD-10-CM

## 2022-07-13 DIAGNOSIS — F331 Major depressive disorder, recurrent, moderate: Secondary | ICD-10-CM

## 2022-07-13 DIAGNOSIS — F5081 Binge eating disorder: Secondary | ICD-10-CM

## 2022-07-13 DIAGNOSIS — F428 Other obsessive-compulsive disorder: Secondary | ICD-10-CM

## 2022-07-13 DIAGNOSIS — F411 Generalized anxiety disorder: Secondary | ICD-10-CM | POA: Diagnosis not present

## 2022-07-13 MED ORDER — DESVENLAFAXINE SUCCINATE ER 50 MG PO TB24: 50.0000 mg | ORAL_TABLET | Freq: Every day | ORAL | 1 refills | Status: DC

## 2022-07-13 MED ORDER — LISDEXAMFETAMINE DIMESYLATE 30 MG PO CAPS: 30.0000 mg | ORAL_CAPSULE | Freq: Every day | ORAL | 0 refills | Status: DC

## 2022-07-13 MED ORDER — DESVENLAFAXINE SUCCINATE ER 100 MG PO TB24: ORAL_TABLET | ORAL | 1 refills | Status: DC

## 2022-07-13 MED ORDER — REXULTI 3 MG PO TABS: 3.0000 mg | ORAL_TABLET | Freq: Every day | ORAL | 1 refills | Status: DC

## 2022-07-13 MED ORDER — ALPRAZOLAM 1 MG PO TABS: ORAL_TABLET | ORAL | 0 refills | Status: DC

## 2022-07-13 MED ORDER — BUSPIRONE HCL 30 MG PO TABS: ORAL_TABLET | ORAL | 1 refills | Status: DC

## 2022-07-13 MED ORDER — ZOLPIDEM TARTRATE ER 12.5 MG PO TBCR: 12.5000 mg | EXTENDED_RELEASE_TABLET | Freq: Every evening | ORAL | 5 refills | Status: DC | PRN

## 2022-07-13 MED ORDER — GABAPENTIN 300 MG PO CAPS: 300.0000 mg | ORAL_CAPSULE | Freq: Three times a day (TID) | ORAL | 1 refills | Status: DC

## 2022-07-13 NOTE — Progress Notes (Unsigned)
Mario Briggs [id number] 05/26/1971 51 y.o.  Subjective:   Patient ID:  Mario Briggs is a 51 y.o. (DOB 1970/08/23) male.  Chief Complaint: No chief complaint on file.   HPI Mario Briggs presents to the office today for follow-up of ***  "It's been a hectic over the last few months... with all things considered, I'm hanging in there."  Wife had a CVA about 2.5 weeks ago. He reports that she is continuing to recover cognitively.   He was placed on an Action Plan in early November for not following a call flow. He has been out on FMLA to care for wife.   He reports that his anxiety has been "really high... just trying to take it one day at a time." He reports that he continues to have anxiety about performing his job and will move his hours to later in the week. He reports that he has been having panic attacks, to include while he has been taking care of wife. He reports that he had panic while wife was in the hospital and trying to make decisions about her care. He reports frequent worry. He has been trying not to think about possibly losing his job. Depression has not been as much of an issue as anxiety. "I'm really busy right now." Energy and motivation have been good- "I have been pushed to my limits the last few weeks." Sleeping well. Appetite has been "good." He reports that he has cut out a lot of junk food. He reports 20 lb weight loss. Concentration has been good. Denies SI.   He is caring for his wife at home and they have in home therapy services.   Vyvanse last filled 07/01/22. Alprazolam last filled 07/01/22 Ambien CR last filled 06/16/22. X 3  Past Psychiatric Medication Trials: Viibryd-Felt that it may have become less effective over time. Pristiq Clomipramine Wellbutrin XL Vraylar Rexulti Latuda Xanax Ambien Ambien CR Trazodone Buspar-Initially helpful Vyvanse- Somewhat helpful for concentration and energy. May cause some restlessness. Gabapentin  AIMS     Flowsheet Row Office Visit from 04/27/2022 in [place] Visit from 01/21/2022 in [facility] Visit from 09/27/2021 in [facility] Visit from 03/29/2021 in [facility] Visit from 01/19/2021 in [facility] Total Score 0 0 0 0 0        Review of Systems:  Review of Systems  HENT:  Positive for congestion.        Recent URI  Musculoskeletal:  Negative for gait problem.  Neurological:  Negative for tremors.  Psychiatric/Behavioral:         Please refer to HPI    Medications: I have reviewed the patient's current medications.  Current Outpatient Medications  Medication Sig Dispense Refill   ALPRAZolam (XANAX) 1 MG tablet TAKE 1 TABLET(1 MG) BY MOUTH FOUR TIMES DAILY AS NEEDED FOR ANXIETY 360 tablet 1   Aspirin Effervescent (ALKA-SELTZER PO) Take by mouth.     bismuth subsalicylate (PEPTO BISMOL) 262 MG/15ML suspension Take 30 mLs by mouth every 6 (six) hours as needed.     Brexpiprazole (REXULTI) 3 MG TABS Take 1 tablet (3 mg total) by mouth daily. 90 tablet 1   busPIRone (BUSPAR) 30 MG tablet TAKE 1 TABLET(30 MG) BY MOUTH TWICE DAILY 180 tablet 1   cholecalciferol (VITAMIN D3) 25 MCG (1000 UNIT) tablet Take 1,000 Units by mouth 2 (two) times daily.     desvenlafaxine (PRISTIQ) 100 MG 24 hr tablet  TAKE 1 TABLET(100 MG) BY MOUTH DAILY AFTER AND BREAKFAST 90 tablet 1   desvenlafaxine (PRISTIQ) 50 MG 24 hr tablet Take 1 tablet (50 mg total) by mouth daily. Take with 100 mg tab to equal total dose of 150 mg 90 tablet 1   fexofenadine (ALLEGRA) 60 MG tablet Take 60 mg by mouth daily.     finasteride (PROSCAR) 5 MG tablet Take 5 mg by mouth.     fluticasone (FLONASE) 50 MCG/ACT nasal spray SMARTSIG:1-2 Spray(s) Both Nares Daily     gabapentin (NEURONTIN) 300 MG capsule Take 1 capsule (300 mg total) by mouth 3 (three) times daily. 270 capsule 1   lisdexamfetamine (VYVANSE) 30 MG  capsule Take 1 capsule (30 mg total) by mouth daily. 30 capsule 0   lisdexamfetamine (VYVANSE) 30 MG capsule Take 1 capsule (30 mg total) by mouth daily after breakfast. 30 capsule 0   [START ON 07/20/2022] lisdexamfetamine (VYVANSE) 30 MG capsule Take 1 capsule (30 mg total) by mouth daily. 30 capsule 0   lisdexamfetamine (VYVANSE) 30 MG capsule Take 1 capsule (30 mg total) by mouth daily. 30 capsule 0   lisinopril (ZESTRIL) 10 MG tablet Take 10 mg by mouth daily.     loratadine (CLARITIN) 10 MG tablet Take 10 mg by mouth daily as needed for allergies.     montelukast (SINGULAIR) 10 MG tablet Take 1 tablet by mouth daily.     Olopatadine HCl 0.6 % SOLN Place into both nostrils. (Patient not taking: Reported on 04/27/2022)     pseudoephedrine (SUDAFED) 30 MG tablet Take 30 mg by mouth every 4 (four) hours as needed for congestion. (Patient not taking: Reported on 01/21/2022)     psyllium (REGULOID) 0.52 g capsule Take 0.52 g by mouth daily.     tadalafil (CIALIS) 5 MG tablet Take 5 mg by mouth daily.     tamsulosin (FLOMAX) 0.4 MG CAPS capsule Take 0.4 mg by mouth.     zolpidem (AMBIEN CR) 12.5 MG CR tablet Take 1 tablet (12.5 mg total) by mouth at bedtime as needed for sleep. 30 tablet 5   No current facility-administered medications for this visit.    Medication Side Effects: None  Allergies: No Known Allergies  No past medical history on file.  Past Medical History, Surgical history, Social history, and Family history were reviewed and updated as appropriate.   Please see review of systems for further details on the patient's review from today.   Objective:   Physical Exam:  There were no vitals taken for this visit.  Physical Exam  Lab Review:     Component Value Date/Time   NA 142 03/03/2021 1339   K 4.7 03/03/2021 1339   CL 105 03/03/2021 1339   CO2 27 03/03/2021 1339   GLUCOSE 95 03/03/2021 1339   BUN 10 03/03/2021 1339   CREATININE 1.21 03/03/2021 1339   CALCIUM 9.7  03/03/2021 1339   PROT 6.8 03/03/2021 1339   AST 20 03/03/2021 1339   ALT 25 03/03/2021 1339   BILITOT 0.3 03/03/2021 1339    No results found for: "WBC", "RBC", "HGB", "HCT", "PLT", "MCV", "MCH", "MCHC", "RDW", "LYMPHSABS", "MONOABS", "EOSABS", "BASOSABS"  No results found for: "POCLITH", "LITHIUM"   No results found for: "PHENYTOIN", "PHENOBARB", "VALPROATE", "CBMZ"   .res Assessment: Plan:    There are no diagnoses linked to this encounter.   Please see After Visit Summary for patient specific instructions.  No future appointments.  No orders of the defined types were  placed in this encounter.   -------------------------------

## 2022-09-13 ENCOUNTER — Ambulatory Visit (INDEPENDENT_AMBULATORY_CARE_PROVIDER_SITE_OTHER): Payer: 59 | Admitting: Psychiatry

## 2022-09-13 ENCOUNTER — Encounter: Payer: Self-pay | Admitting: Psychiatry

## 2022-09-13 DIAGNOSIS — F5081 Binge eating disorder: Secondary | ICD-10-CM

## 2022-09-13 DIAGNOSIS — F331 Major depressive disorder, recurrent, moderate: Secondary | ICD-10-CM | POA: Diagnosis not present

## 2022-09-13 DIAGNOSIS — F411 Generalized anxiety disorder: Secondary | ICD-10-CM

## 2022-09-13 DIAGNOSIS — F428 Other obsessive-compulsive disorder: Secondary | ICD-10-CM | POA: Diagnosis not present

## 2022-09-13 MED ORDER — DESVENLAFAXINE SUCCINATE ER 100 MG PO TB24: 200.0000 mg | ORAL_TABLET | Freq: Every day | ORAL | 1 refills | Status: DC

## 2022-09-13 NOTE — Progress Notes (Signed)
Mario Winterhalter [id number] Nov 18, 1970 52 y.o.  Subjective:   Patient ID:  Mario Briggs is a 52 y.o. (DOB 07/24/1971) male.  Chief Complaint:  Chief Complaint  Patient presents with   Anxiety   Depression    Anxiety    Depression        Past medical history includes anxiety.    Cage Holleman presents to the office today for follow-up of anxiety   He is accompanied by his wife. He reports that he has been having difficulty going to work and has used all of his sick/emergency personal time for the year to cover when he is unable to work due to anxiety. He also will have anxiety during a shift and then tell work he will make up the day over the weekend because he is unable to continue due to severe anxiety.  He reports that he has been put on a call behavior action plan because his production is not where they would like. He reports that this has triggered panic attacks while working. He reports panic attacks are occurring daily. He reports, "it's a struggle not to pull out of" a call with a customer. He continues to have severe anxiety in anticipation of work. He reports catastrophic thoughts related to work.   He reports depressed mood and "hopeless...scared, I feel like a failure." He reports excessive feelings of guilt and that he is not "coming through" for his wife. He reports low energy and motivation- "I'm falling behind." He reports that his concentration has been impaired and that he has found where he did not pay some bills on time. Sleeping ok. He stopped using cPap for while and he started using mask again since snoring was interfering with wife's sleep. Wife reports that he is sleeping excessively. He reports that his appetite is "ok." He reports eating less "because I don't feel like getting up and fixing" food. Denies SI.   Wife continues to recover from CVA.   Past Psychiatric Medication Trials: Viibryd-Felt that it may have become less effective over  time. Pristiq Clomipramine Wellbutrin XL Vraylar Rexulti Latuda Xanax Ambien Ambien CR Trazodone Buspar-Initially helpful Vyvanse- Somewhat helpful for concentration and energy. May cause some restlessness. Gabapentin  [place] Office Visit from 07/13/2022 in [facility] Office Visit from 04/27/2022 in [facility] Office Visit from 01/21/2022 in [facility] Office Visit from 09/27/2021 in [facility] Office Visit from 03/29/2021 in [facility]  AIMS Total Score 0 0 0 0 0        Review of Systems:  Review of Systems  Respiratory:         Chest tightness related to anxiety  Gastrointestinal:        He reports, "my stomach is in knots all the time."  Musculoskeletal:  Negative for gait problem.  Neurological:  Negative for tremors.  Psychiatric/Behavioral:  Positive for depression.        Please refer to HPI    Medications: I have reviewed the patient's current medications.  Current Outpatient Medications  Medication Sig Dispense Refill   [START ON 09/23/2022] ALPRAZolam (XANAX) 1 MG tablet TAKE 1 TABLET(1 MG) BY MOUTH FOUR TIMES DAILY AS NEEDED FOR ANXIETY 360 tablet 0   Aspirin Effervescent (ALKA-SELTZER PO) Take by mouth.     Brexpiprazole (REXULTI) 3 MG TABS Take 1 tablet (3 mg total) by mouth daily. 90 tablet 1  busPIRone (BUSPAR) 30 MG tablet TAKE 1 TABLET(30 MG) BY MOUTH TWICE DAILY 180 tablet 1   cholecalciferol (VITAMIN D3) 25 MCG (1000 UNIT) tablet Take 1,000 Units by mouth 2 (two) times daily.     fexofenadine (ALLEGRA) 60 MG tablet Take 60 mg by mouth daily.     finasteride (PROSCAR) 5 MG tablet Take 5 mg by mouth.     fluticasone (FLONASE) 50 MCG/ACT nasal spray SMARTSIG:1-2 Spray(s) Both Nares Daily     gabapentin (NEURONTIN) 300 MG capsule Take 1 capsule (300 mg total) by mouth 3 (three) times daily. 270  capsule 1   Inulin (FIBER CHOICE PO) Take by mouth.     lisinopril (ZESTRIL) 10 MG tablet Take 10 mg by mouth daily.     loratadine (CLARITIN) 10 MG tablet Take 10 mg by mouth daily as needed for allergies.     montelukast (SINGULAIR) 10 MG tablet Take 1 tablet by mouth daily.     tadalafil (CIALIS) 5 MG tablet Take 5 mg by mouth daily.     tamsulosin (FLOMAX) 0.4 MG CAPS capsule Take 0.4 mg by mouth.     zolpidem (AMBIEN CR) 12.5 MG CR tablet Take 1 tablet (12.5 mg total) by mouth at bedtime as needed for sleep. 30 tablet 5   desvenlafaxine (PRISTIQ) 100 MG 24 hr tablet Take 2 tablets (200 mg total) by mouth daily. 60 tablet 1   Olopatadine HCl 0.6 % SOLN Place into both nostrils. (Patient not taking: Reported on 04/27/2022)     pseudoephedrine (SUDAFED) 30 MG tablet Take 30 mg by mouth every 4 (four) hours as needed for congestion. (Patient not taking: Reported on 01/21/2022)     psyllium (REGULOID) 0.52 g capsule Take 0.52 g by mouth daily.     No current facility-administered medications for this visit.    Medication Side Effects: Other: Possible drowsiness  Allergies: No Known Allergies  History reviewed. No pertinent past medical history.  Past Medical History, Surgical history, Social history, and Family history were reviewed and updated as appropriate.   Please see review of systems for further details on the patient's review from today.   Objective:   Physical Exam:  BP 120/73   Pulse 99   Physical Exam Constitutional:      General: He is not in acute distress. Musculoskeletal:        General: No deformity.  Neurological:     Mental Status: He is alert and oriented to person, place, and time.     Coordination: Coordination normal.  Psychiatric:        Attention and Perception: Attention and perception normal. He does not perceive auditory or visual hallucinations.        Mood and Affect: Mood is anxious and depressed. Affect is not labile, blunt, angry or  inappropriate.        Speech: Speech normal.        Behavior: Behavior normal.        Thought Content: Thought content normal. Thought content is not paranoid or delusional. Thought content does not include homicidal or suicidal ideation. Thought content does not include homicidal or suicidal plan.        Cognition and Memory: Cognition and memory normal.        Judgment: Judgment normal.     Comments: Insight intact     Lab Review:     Component Value Date/Time   NA 142 03/03/2021 1339   K 4.7 03/03/2021 1339   CL 105 03/03/2021  1339   CO2 27 03/03/2021 1339   GLUCOSE 95 03/03/2021 1339   BUN 10 03/03/2021 1339   CREATININE 1.21 03/03/2021 1339   CALCIUM 9.7 03/03/2021 1339   PROT 6.8 03/03/2021 1339   AST 20 03/03/2021 1339   ALT 25 03/03/2021 1339   BILITOT 0.3 03/03/2021 1339    No results found for: "WBC", "RBC", "HGB", "HCT", "PLT", "MCV", "MCH", "MCHC", "RDW", "LYMPHSABS", "MONOABS", "EOSABS", "BASOSABS"  No results found for: "POCLITH", "LITHIUM"   No results found for: "PHENYTOIN", "PHENOBARB", "VALPROATE", "CBMZ"   .res Assessment: Plan:    Recommend leave of absence from work until 10/18/22 due to severe anxiety, panic, and depression.  Will increase Pristiq to 200 mg po qd to possibly improve anxiety and depression.  Will stop Vyvanse to determine if this improves anxiety. Continue Alprazolam 1 mg four times daily as needed for anxiety.  Continue Rexulti 3 mg po qd for depression.  Continue Buspar 30 mg po BID for anxiety.  Continue Gabapentin 300 mg po TID for anxiety.  Continue Ambien CR 12.5 mg po QHS for insomnia.  Discussed potential benefits of therapy and pt agrees to starting therapy. Referral made to therapist.  Pt to follow-up with this provider in 4 weeks or sooner if clinically indicated.  Patient advised to contact office with any questions, adverse effects, or acute worsening in signs and symptoms.   Mario Briggs was seen today for anxiety and  depression.  Diagnoses and all orders for this visit:  Generalized anxiety disorder -     desvenlafaxine (PRISTIQ) 100 MG 24 hr tablet; Take 2 tablets (200 mg total) by mouth daily.  Binge eating disorder -     desvenlafaxine (PRISTIQ) 100 MG 24 hr tablet; Take 2 tablets (200 mg total) by mouth daily.  Moderate episode of recurrent major depressive disorder (HCC) -     desvenlafaxine (PRISTIQ) 100 MG 24 hr tablet; Take 2 tablets (200 mg total) by mouth daily.  Other obsessive-compulsive disorder -     desvenlafaxine (PRISTIQ) 100 MG 24 hr tablet; Take 2 tablets (200 mg total) by mouth daily.     Please see After Visit Summary for patient specific instructions.  Future Appointments  Date Time Provider [place]  09/22/2022  4:00 PM Jannifer Hick [facility] CP-[facility] None  10/11/2022  1:15 PM Thayer Headings, PMHNP CP-CP None    No orders of the defined types were placed in this encounter.   -------------------------------

## 2022-09-14 ENCOUNTER — Telehealth: Payer: Self-pay | Admitting: Psychiatry

## 2022-09-14 NOTE — Telephone Encounter (Signed)
Incoming fax received for [employer] for Disability PPWk from [facility].  Transferred to nurse.

## 2022-09-14 NOTE — Telephone Encounter (Signed)
Next visit is 10/11/22. Mario Briggs called and said that he saw Mario Briggs yesterday and was given a RX for Pristiq. He states the pharmacy told him it was too early to fill the Pristiq and told him they don't see the 200 mg in his chart. They said the status of this  RX was cancelled. Pharmacy is:  [id number] - [place], [place] - [address] AT [place]   Phone: [phone]  Fax: [phone]

## 2022-09-14 NOTE — Telephone Encounter (Signed)
Told patient to take four 50-mg tablets until he runs out and then try to fill the new Rx.  Told him he does have an Rx for 100 mg tablets, take 2.  Told him to let us know if he has any trouble filling the new Rx when he needed it.

## 2022-09-22 ENCOUNTER — Ambulatory Visit (INDEPENDENT_AMBULATORY_CARE_PROVIDER_SITE_OTHER): Payer: 59 | Admitting: Behavioral Health

## 2022-09-22 DIAGNOSIS — F331 Major depressive disorder, recurrent, moderate: Secondary | ICD-10-CM | POA: Diagnosis not present

## 2022-09-22 DIAGNOSIS — F411 Generalized anxiety disorder: Secondary | ICD-10-CM
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
## 2022-09-23 DIAGNOSIS — Z0289 Encounter for other administrative examinations: Secondary | ICD-10-CM

## 2022-09-23 NOTE — Telephone Encounter (Signed)
Forms completed, lengthy forms for mental health evaluation for his leave.  Given to Janett Billow to review and sign

## 2022-09-25 ENCOUNTER — Encounter: Payer: Self-pay | Admitting: Behavioral Health

## 2022-10-05 ENCOUNTER — Ambulatory Visit (INDEPENDENT_AMBULATORY_CARE_PROVIDER_SITE_OTHER): Payer: 59 | Admitting: Behavioral Health

## 2022-10-05 ENCOUNTER — Ambulatory Visit: Payer: 59 | Admitting: Behavioral Health

## 2022-10-05 DIAGNOSIS — F331 Major depressive disorder, recurrent, moderate: Secondary | ICD-10-CM

## 2022-10-05 DIAGNOSIS — F411 Generalized anxiety disorder: Secondary | ICD-10-CM

## 2022-10-05 NOTE — Progress Notes (Signed)
[facility] Counselor/Therapist Progress Note  Patient ID: Mario Briggs, MRN: [id number],    Date: 10/06/2022  Time Spent: 60 minutes   Treatment Type: Psychotherapy  Reported Symptoms: Anxiety   Mental Status Exam:  Appearance:   Casual     Behavior:  Appropriate and Sharing  Motor:  Normal  Speech/Language:   Clear and Coherent  Affect:  Appropriate and Congruent  Mood:  anxious and sad  Thought process:  normal  Thought content:    WNL  Sensory/Perceptual disturbances:    WNL  Orientation:  oriented to person  Attention:  Good  Concentration:  Good  Memory:  WNL  Fund of knowledge:   Good  Insight:    Good  Judgment:   Good  Impulse Control:  Good   Risk Assessment: Danger to Self:  No Self-injurious Behavior: No Danger to Others: No Duty to Warn:no Physical Aggression / Violence:No  Access to Firearms a concern: No  Gang Involvement:No   Subjective:   The patient reports feeling "Kind of down and very anxious". The patient identified triggers for anxiety and depression as "My work, but it doesn't just have to be work". The patient identifyed symptoms of anxiety as "Worrying, real upset stomach, tightness in my chest". He reports his last panic attack as 09/12/22. He reports he is currently out on leave and states plans to return to work on 10/18/22. 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 He identified stress at work as "Some of it has to do with the calls their self, but some of it is the way they decide how I am doing the work". He reports he has been employed with the organization for 30 years he has a remote job that allows him to work from home. He states a change occurred six months ago regarding if calls are done appropriately. He states he is rated on his performance by artificial intelligence. The patient states each call is rated and he  reports his performance level ranges in the mid 70's which is below what the company prefers. He states he is rated based on the performance of other employees. He reports he is currently on a call behavioral action plan since January. The patient reports belief that he has panic attacks on the phone. He states "It really got bad when they started this call behavior action plan they started in November".   He identified his coping mechanism for panic attacks as "Take Xanax". He reports the panic attacks last approximately 10 minutes. He identified thoughts prior to having a panic attakc as "Thinking about weather this is gonna be the call they listen to or not. Work is a big part of it, but being out of work I am anxious. I worry about my wife because she had that stroke recently, I worry about my mom". The patient was unable to identfy when anxiety is helpful for him. On a scale of 1 to 10 with 10 being severe he rated his anxiety at a 6 today and depression as "My depression has been pretty good since the last month I have been out of work. So I'd say we can trace a lot of that to work". 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 He identified options of his current employment as increase his perofrmance or find another job. He states finding another  job gives him anxiety. He identiifed pros of finding another job as "It would be a better fit maybe for my talents". He states the cons of finding another job as "I think my talent set is kind of limited. I imagine if I found another job it would be in the singular field. I worry I wouldn't make as much as I do now. I like working from home and I might not be able to do that anymore". He states pros of staying with his current employer as "I make a comfortable living, I like working from home, I been doing it so long that I can almost do it in my sleep". The patient participated in an acitivty in session that allowed  him to view his situation from a different perspective. He identified his take away from today's session as "I feel empowered like my future is not in someone else's hands". The patient denied SI/HI, AH/VH. He reported interest with continuing to receive therapy in person and reports being fine with transitioning to another therapist if needed.   Counselor conducted check in. Counselor discussed mental health symptoms experienced and requested for the patient to rate the symptoms. Counselor educated the patient on anticipatory anxiety. Counselor discussed fight or flight mode. Counselor questioned pros and cons of the patient staying with his current employer or obtaining another job. Counselor conducted a role play activity in session with the patient to assist him with creating a plan for his future. Counselor assessed for SI/HI, AH/VH. Counselor informed the patient her time is limited with [facility].   Interventions: Motivational Interviewing and Gestalt/Psychodrama  Diagnosis:   ICD-10-CM   1. Generalized anxiety disorder  F41.1     2. Moderate episode of recurrent major depressive disorder (HCC)  F33.1      Plan:   The patient reports "Some kind of coping mechnisms for anxiety".    1) Long Term Goal: Develop strategies to reduce symptoms.     Short Term Goal: Reduce anxiety and improve coping skills.     Objective: Manage panic attacks.     Objective: Develop strategies for thought distraction when fixating on the future.     Objective: Learn two new ways of coping with routine stressors.     Objective: Recognize and plan for anxiety provoking situations.    The patient states interest with attending therapy once a month with an understanding that the frequency can be adjusted as needed.        Jannifer Hick, [facility]

## 2022-10-06 ENCOUNTER — Encounter: Payer: Self-pay | Admitting: Behavioral Health

## 2022-10-11 ENCOUNTER — Ambulatory Visit (INDEPENDENT_AMBULATORY_CARE_PROVIDER_SITE_OTHER): Payer: 59 | Admitting: Psychiatry

## 2022-10-11 ENCOUNTER — Encounter: Payer: Self-pay | Admitting: Psychiatry

## 2022-10-11 DIAGNOSIS — F331 Major depressive disorder, recurrent, moderate: Secondary | ICD-10-CM

## 2022-10-11 DIAGNOSIS — F3341 Major depressive disorder, recurrent, in partial remission: Secondary | ICD-10-CM

## 2022-10-11 DIAGNOSIS — F428 Other obsessive-compulsive disorder: Secondary | ICD-10-CM | POA: Diagnosis not present

## 2022-10-11 DIAGNOSIS — F411 Generalized anxiety disorder: Secondary | ICD-10-CM | POA: Diagnosis not present

## 2022-10-11 DIAGNOSIS — F5105 Insomnia due to other mental disorder: Secondary | ICD-10-CM

## 2022-10-11 DIAGNOSIS — F5081 Binge eating disorder: Secondary | ICD-10-CM | POA: Diagnosis not present

## 2022-10-11 MED ORDER — REXULTI 3 MG PO TABS: 3.0000 mg | ORAL_TABLET | Freq: Every day | ORAL | 1 refills | Status: DC

## 2022-10-11 MED ORDER — BUSPIRONE HCL 30 MG PO TABS: ORAL_TABLET | ORAL | 0 refills | Status: DC

## 2022-10-11 MED ORDER — DESVENLAFAXINE SUCCINATE ER 100 MG PO TB24: 200.0000 mg | ORAL_TABLET | Freq: Every day | ORAL | 1 refills | Status: DC

## 2022-10-11 MED ORDER — ALPRAZOLAM 1 MG PO TABS: ORAL_TABLET | ORAL | 0 refills | Status: DC

## 2022-10-11 NOTE — Progress Notes (Unsigned)
Mario Briggs [id number] Nov 04, 1970 52 y.o.  Subjective:   Patient ID:  Mario Briggs is a 51 y.o. (DOB January 16, 1971) male.  Chief Complaint: No chief complaint on file.   HPI Mario Briggs presents to the office today for follow-up of ***  Accompanied by his wife.   He reports that his depression has improved. He reports, "I've been taking a lot less Xanax." He does not recall having a panic attack since start of medical leave. He reports that he has had some anxiety- "I always have some level of worry, but not as much."   Denies irritability. Sleeping well. He reports that he tries to use cPap and it is difficult for him to be consistent with this. He reports that he continues to have water building up inside of cPap mask. He reports that his energy remains low. "I am more motivated now." He reports that his appetite is good. He reports adequate concentration. Denies SI.   Wife continues to recover from CVA. He reports, "I worry about my mom. She's always got a tough situation going on."   He reports that he had difficulty filling Pristiq 100 mg two tabs daily and has been taking 200 mg using 50 mg tabs from previous prescription.   Past Psychiatric Medication Trials: Viibryd-Felt that it may have become less effective over time. Pristiq Clomipramine Wellbutrin XL Vraylar Rexulti Latuda Xanax Ambien Ambien CR Trazodone Buspar-Initially helpful Vyvanse- Somewhat helpful for concentration and energy. May cause some restlessness. Gabapentin  [place] Office Visit from 07/13/2022 in [place] Office Visit from 04/27/2022 in [place] Office Visit from 01/21/2022 in [facility] Office Visit from 09/27/2021 in [facility] Office Visit from 03/29/2021 in [facility]  AIMS Total Score 0 0 0 0 0        Review of Systems:   Review of Systems  Cardiovascular:        Tightness in chest at times with anxiety  Gastrointestinal:  Positive for abdominal pain.  Musculoskeletal:  Negative for gait problem.  Psychiatric/Behavioral:         Please refer to HPI    Medications: I have reviewed the patient's current medications.  Current Outpatient Medications  Medication Sig Dispense Refill   ALPRAZolam (XANAX) 1 MG tablet TAKE 1 TABLET(1 MG) BY MOUTH FOUR TIMES DAILY AS NEEDED FOR ANXIETY 360 tablet 0   Aspirin Effervescent (ALKA-SELTZER PO) Take by mouth.     Brexpiprazole (REXULTI) 3 MG TABS Take 1 tablet (3 mg total) by mouth daily. 90 tablet 1   busPIRone (BUSPAR) 30 MG tablet TAKE 1 TABLET(30 MG) BY MOUTH TWICE DAILY 180 tablet 1   cholecalciferol (VITAMIN D3) 25 MCG (1000 UNIT) tablet Take 1,000 Units by mouth 2 (two) times daily.     desvenlafaxine (PRISTIQ) 100 MG 24 hr tablet Take 2 tablets (200 mg total) by mouth daily. 60 tablet 1   fexofenadine (ALLEGRA) 60 MG tablet Take 60 mg by mouth daily.     finasteride (PROSCAR) 5 MG tablet Take 5 mg by mouth.     fluticasone (FLONASE) 50 MCG/ACT nasal spray SMARTSIG:1-2 Spray(s) Both Nares Daily     gabapentin (NEURONTIN) 300 MG capsule Take 1 capsule (300 mg total) by mouth 3 (three) times daily. 270 capsule 1   Inulin (FIBER CHOICE PO) Take by mouth.     lisinopril (ZESTRIL) 10 MG tablet Take 10  mg by mouth daily.     loratadine (CLARITIN) 10 MG tablet Take 10 mg by mouth daily as needed for allergies.     montelukast (SINGULAIR) 10 MG tablet Take 1 tablet by mouth daily.     Olopatadine HCl 0.6 % SOLN Place into both nostrils. (Patient not taking: Reported on 04/27/2022)     pseudoephedrine (SUDAFED) 30 MG tablet Take 30 mg by mouth every 4 (four) hours as needed for congestion. (Patient not taking: Reported on 01/21/2022)     psyllium (REGULOID) 0.52 g capsule Take 0.52 g by mouth daily.     tadalafil (CIALIS) 5 MG tablet Take 5 mg by mouth daily.      tamsulosin (FLOMAX) 0.4 MG CAPS capsule Take 0.4 mg by mouth.     zolpidem (AMBIEN CR) 12.5 MG CR tablet Take 1 tablet (12.5 mg total) by mouth at bedtime as needed for sleep. 30 tablet 5   No current facility-administered medications for this visit.    Medication Side Effects: None  Allergies: No Known Allergies  History reviewed. No pertinent past medical history.  Past Medical History, Surgical history, Social history, and Family history were reviewed and updated as appropriate.   Please see review of systems for further details on the patient's review from today.   Objective:   Physical Exam:  BP 108/66   Pulse 88   Physical Exam  Lab Review:     Component Value Date/Time   NA 142 03/03/2021 1339   K 4.7 03/03/2021 1339   CL 105 03/03/2021 1339   CO2 27 03/03/2021 1339   GLUCOSE 95 03/03/2021 1339   BUN 10 03/03/2021 1339   CREATININE 1.21 03/03/2021 1339   CALCIUM 9.7 03/03/2021 1339   PROT 6.8 03/03/2021 1339   AST 20 03/03/2021 1339   ALT 25 03/03/2021 1339   BILITOT 0.3 03/03/2021 1339    No results found for: "WBC", "RBC", "HGB", "HCT", "PLT", "MCV", "MCH", "MCHC", "RDW", "LYMPHSABS", "MONOABS", "EOSABS", "BASOSABS"  No results found for: "POCLITH", "LITHIUM"   No results found for: "PHENYTOIN", "PHENOBARB", "VALPROATE", "CBMZ"   .res Assessment: Plan:    Recommend return to work, full-time without restriction on 10/18/22.   Diagnoses and all orders for this visit:  Generalized anxiety disorder  Binge eating disorder  Recurrent major depressive disorder, in partial remission ([place])  Other obsessive-compulsive disorder     Please see After Visit Summary for patient specific instructions.  Future Appointments  Date Time Provider [place]  10/31/2022 10:00 AM Jannifer Hick, [facility] CP-CP None    No orders of the defined types were placed in this encounter.   -------------------------------

## 2022-10-12 ENCOUNTER — Ambulatory Visit: Payer: 59 | Admitting: Psychiatry

## 2022-10-31 ENCOUNTER — Ambulatory Visit: Payer: 59 | Admitting: Behavioral Health

## 2022-12-13 ENCOUNTER — Telehealth: Payer: Self-pay | Admitting: Psychiatry

## 2022-12-13 DIAGNOSIS — F3341 Major depressive disorder, recurrent, in partial remission: Secondary | ICD-10-CM

## 2022-12-13 DIAGNOSIS — F411 Generalized anxiety disorder: Secondary | ICD-10-CM

## 2022-12-13 DIAGNOSIS — F5081 Binge eating disorder: Secondary | ICD-10-CM

## 2022-12-13 DIAGNOSIS — F428 Other obsessive-compulsive disorder: Secondary | ICD-10-CM

## 2022-12-13 DIAGNOSIS — F50819 Binge eating disorder, unspecified: Secondary | ICD-10-CM

## 2022-12-13 MED ORDER — DESVENLAFAXINE SUCCINATE ER 100 MG PO TB24: 200.0000 mg | ORAL_TABLET | Freq: Every day | ORAL | 0 refills | Status: DC

## 2022-12-13 NOTE — Telephone Encounter (Signed)
Rx had expired. Sent in new Rx and notified patient.

## 2022-12-13 NOTE — Telephone Encounter (Signed)
Next appt is 01/11/23. Mario Briggs has been on Pristiq and states it was increased from 150 mg to 200 mg. He states that [facility] told him that they don't have an RX showing 200 mg. Pharmacy states that it was denied. Please call him at [phone].

## 2023-01-11 ENCOUNTER — Telehealth: Payer: Self-pay | Admitting: Psychiatry

## 2023-01-11 ENCOUNTER — Ambulatory Visit (INDEPENDENT_AMBULATORY_CARE_PROVIDER_SITE_OTHER): Payer: 59 | Admitting: Psychiatry

## 2023-01-11 ENCOUNTER — Encounter: Payer: Self-pay | Admitting: Psychiatry

## 2023-01-11 VITALS — BP 117/77 | HR 90

## 2023-01-11 DIAGNOSIS — F428 Other obsessive-compulsive disorder: Secondary | ICD-10-CM

## 2023-01-11 DIAGNOSIS — F3341 Major depressive disorder, recurrent, in partial remission: Secondary | ICD-10-CM

## 2023-01-11 DIAGNOSIS — F331 Major depressive disorder, recurrent, moderate: Secondary | ICD-10-CM

## 2023-01-11 DIAGNOSIS — F5081 Binge eating disorder: Secondary | ICD-10-CM

## 2023-01-11 DIAGNOSIS — F5105 Insomnia due to other mental disorder: Secondary | ICD-10-CM | POA: Diagnosis not present

## 2023-01-11 DIAGNOSIS — F330 Major depressive disorder, recurrent, mild: Secondary | ICD-10-CM

## 2023-01-11 DIAGNOSIS — F411 Generalized anxiety disorder: Secondary | ICD-10-CM | POA: Diagnosis not present

## 2023-01-11 DIAGNOSIS — R112 Nausea with vomiting, unspecified: Secondary | ICD-10-CM

## 2023-01-11 MED ORDER — DESVENLAFAXINE SUCCINATE ER 100 MG PO TB24: 200.0000 mg | ORAL_TABLET | Freq: Every day | ORAL | 0 refills | Status: DC

## 2023-01-11 MED ORDER — BUSPIRONE HCL 30 MG PO TABS: ORAL_TABLET | ORAL | 0 refills | Status: DC

## 2023-01-11 MED ORDER — ONDANSETRON HCL 4 MG PO TABS: 4.0000 mg | ORAL_TABLET | Freq: Three times a day (TID) | ORAL | 5 refills | Status: DC | PRN

## 2023-01-11 MED ORDER — ZOLPIDEM TARTRATE ER 12.5 MG PO TBCR: 12.5000 mg | EXTENDED_RELEASE_TABLET | Freq: Every evening | ORAL | 5 refills | Status: DC | PRN

## 2023-01-11 MED ORDER — GABAPENTIN 300 MG PO CAPS: 300.0000 mg | ORAL_CAPSULE | Freq: Three times a day (TID) | ORAL | 1 refills | Status: DC

## 2023-01-11 MED ORDER — REXULTI 3 MG PO TABS: 3.0000 mg | ORAL_TABLET | Freq: Every day | ORAL | 1 refills | Status: DC

## 2023-01-11 MED ORDER — ALPRAZOLAM 1 MG PO TABS: ORAL_TABLET | ORAL | 0 refills | Status: DC

## 2023-01-11 NOTE — Telephone Encounter (Signed)
LVM to RC 

## 2023-01-11 NOTE — Telephone Encounter (Signed)
Called pharmacy and canceled any Rx for 100 mg qd.  They have the 200 mg Rx to fill on 6/18, as that is the date insurance will cover. Verified that patient has enough to cover him until that RF. Patient has had several Rx for 200 mg so I'm not sure what the issue was, unless patient was calling in with the Rx # from an older script.

## 2023-01-11 NOTE — Telephone Encounter (Signed)
Pt reports that pharmacy is continuing to not dispense Pristiq 200 mg daily and have said he does not have a script for this dose. It looks like he called on 12/13/22 and a new script was sent at that time. He reports that he is still only receiving 100 mg daily. Would you please call pharmacy to request that they fill script for 200 mg daily?

## 2023-01-11 NOTE — Progress Notes (Signed)
Mario Briggs [id number] [birth year] 52 y.o.  Subjective:   Patient ID:  Mario Briggs is a 52 y.o. (DOB [birth year]) male.  Chief Complaint:  Chief Complaint  Patient presents with   Depression   Anxiety    HPI Mario Briggs presents to the office today for follow-up of anxiety, depression, and insomnia. He reports difficulty getting pharmacy to fill Pristiq 200 mg and has therfore been taking 150 mg daily. He asks about Promethazine for nausea with anxiety. He reports that he has nausea right before starting work. Denies vomiting. He reports frequent diarrhea that may be anxiety related. He has occasional panic with work, typically when he is returning to work after some time off. He continues to postpone shifts due to anxiety. He reports, "I don't have very good metrics" and believes anxiety contributes to this. No longer on Performance Improvement Plan. Has a new team leader and "not knowing what to expect" has caused some anxiety. He reports some worry about his wife and her recovering from CVA. He reports that otherwise he denies significant worry.   He reports that his depression has been "ok." He reports that his energy is low. He reports motivation is lower than he would like. Reports motivation is somewhat better compared to a month ago. Concentration has been ok. He reports that he has had increased appetite. Wife reports that he will binge eat. Sleeping well. Wife reports that he sleeps excessively, both in the morning and at night. He has not been using cPap recently. Denies SI. "I worry more about getting cancer and dying. Life is so fragile."   He reports that wife has a new PCP who has initiated multiple referrals. He reports that he has been taking wife to multiple appointments. He reports that he also needs to find a new PCP and plans to do this after wife's health stabilizes.   Past Psychiatric Medication Trials: Viibryd-Felt that it may have become less effective over  time. Pristiq Clomipramine Wellbutrin XL Vraylar Rexulti Latuda Xanax Ambien Ambien CR Trazodone Buspar-Initially helpful Vyvanse- Somewhat helpful for concentration and energy. May cause some restlessness. Gabapentin    AIMS    Flowsheet Row Office Visit from 01/11/2023 in [facility] Office Visit from 07/13/2022 in [facility] Office Visit from 04/27/2022 in [facility] Office Visit from 01/21/2022 in [facility] Office Visit from 09/27/2021 in [facility]  AIMS Total Score 0 0 0 0 0        Review of Systems:  Review of Systems  Gastrointestinal:  Positive for diarrhea and nausea. Negative for vomiting.  Musculoskeletal:  Negative for gait problem.  Neurological:  Negative for tremors.  Psychiatric/Behavioral:         Please refer to HPI    Medications: I have reviewed the patient's current medications.  Current Outpatient Medications  Medication Sig Dispense Refill   Aspirin Effervescent (ALKA-SELTZER PO) Take by mouth.     cholecalciferol (VITAMIN D3) 25 MCG (1000 UNIT) tablet Take 1,000 Units by mouth 2 (two) times daily.     fexofenadine (ALLEGRA) 60 MG tablet Take 60 mg by mouth daily.     finasteride (PROSCAR) 5 MG tablet Take 5 mg by mouth.     fluticasone (FLONASE) 50 MCG/ACT nasal spray SMARTSIG:1-2 Spray(s) Both Nares Daily     loratadine (CLARITIN) 10 MG tablet Take 10 mg by mouth daily as needed for allergies.  montelukast (SINGULAIR) 10 MG tablet Take 1 tablet by mouth daily.     ondansetron (ZOFRAN) 4 MG tablet Take 1 tablet (4 mg total) by mouth every 8 (eight) hours as needed for nausea or vomiting. 20 tablet 5   tadalafil (CIALIS) 5 MG tablet Take 5 mg by mouth daily.     [START ON 03/25/2023] ALPRAZolam (XANAX) 1 MG tablet TAKE 1 TABLET(1 MG) BY MOUTH FOUR TIMES DAILY AS NEEDED FOR ANXIETY 360 tablet 0    Brexpiprazole (REXULTI) 3 MG TABS Take 1 tablet (3 mg total) by mouth daily. 90 tablet 1   busPIRone (BUSPAR) 30 MG tablet TAKE 1 TABLET(30 MG) BY MOUTH TWICE DAILY 180 tablet 0   desvenlafaxine (PRISTIQ) 100 MG 24 hr tablet Take 2 tablets (200 mg total) by mouth daily. 180 tablet 0   [START ON 03/08/2023] gabapentin (NEURONTIN) 300 MG capsule Take 1 capsule (300 mg total) by mouth 3 (three) times daily. 270 capsule 1   Inulin (FIBER CHOICE PO) Take by mouth. (Patient not taking: Reported on 01/11/2023)     lisinopril (ZESTRIL) 10 MG tablet Take 10 mg by mouth daily. (Patient not taking: Reported on 01/11/2023)     Olopatadine HCl 0.6 % SOLN Place into both nostrils. (Patient not taking: Reported on 04/27/2022)     pseudoephedrine (SUDAFED) 30 MG tablet Take 30 mg by mouth every 4 (four) hours as needed for congestion. (Patient not taking: Reported on 01/21/2022)     psyllium (REGULOID) 0.52 g capsule Take 0.52 g by mouth daily. (Patient not taking: Reported on 01/11/2023)     tamsulosin (FLOMAX) 0.4 MG CAPS capsule Take 0.4 mg by mouth. (Patient not taking: Reported on 01/11/2023)     [START ON 02/01/2023] zolpidem (AMBIEN CR) 12.5 MG CR tablet Take 1 tablet (12.5 mg total) by mouth at bedtime as needed for sleep. 30 tablet 5   No current facility-administered medications for this visit.    Medication Side Effects: None  Allergies: No Known Allergies  History reviewed. No pertinent past medical history.  Past Medical History, Surgical history, Social history, and Family history were reviewed and updated as appropriate.   Please see review of systems for further details on the patient's review from today.   Objective:   Physical Exam:  BP 117/77   Pulse 90   Physical Exam Constitutional:      General: He is not in acute distress. Musculoskeletal:        General: No deformity.  Neurological:     Mental Status: He is alert and oriented to person, place, and time.     Coordination:  Coordination normal.  Psychiatric:        Attention and Perception: Attention and perception normal. He does not perceive auditory or visual hallucinations.        Mood and Affect: Mood is anxious. Mood is not depressed. Affect is not labile, blunt, angry or inappropriate.        Speech: Speech normal.        Behavior: Behavior normal.        Thought Content: Thought content normal. Thought content is not paranoid or delusional. Thought content does not include homicidal or suicidal ideation. Thought content does not include homicidal or suicidal plan.        Cognition and Memory: Cognition and memory normal.        Judgment: Judgment normal.     Comments: Insight intact     Lab Review:     Component  Value Date/Time   NA 142 03/03/2021 1339   K 4.7 03/03/2021 1339   CL 105 03/03/2021 1339   CO2 27 03/03/2021 1339   GLUCOSE 95 03/03/2021 1339   BUN 10 03/03/2021 1339   CREATININE 1.21 03/03/2021 1339   CALCIUM 9.7 03/03/2021 1339   PROT 6.8 03/03/2021 1339   AST 20 03/03/2021 1339   ALT 25 03/03/2021 1339   BILITOT 0.3 03/03/2021 1339    No results found for: "WBC", "RBC", "HGB", "HCT", "PLT", "MCV", "MCH", "MCHC", "RDW", "LYMPHSABS", "MONOABS", "EOSABS", "BASOSABS"  No results found for: "POCLITH", "LITHIUM"   No results found for: "PHENYTOIN", "PHENOBARB", "VALPROATE", "CBMZ"   .res Assessment: Plan:    Will re-submit script for Pristiq 200 mg daily and request office to help with contacting pharmacy to fill script for 200 mg daily since pt reports that pharmacy continues to fill script for 150 mg daily.  Discussed potential benefits, risks, and side effects of Zofran. Will start Zofran 4 mg every 8 hours as needed for nausea.  Continue Rexulti 3 mg po qd for depression.  Continue Buspar 30 mg po BID for anxiety.  Continue Alprazolam 1 mg four times daily as needed for anxiety.  Continue Ambien CR 12.5 mg po QHS prn insomnia.  Pt to follow-up in 3 months or sooner if  clinically indicated.  Patient advised to contact office with any questions, adverse effects, or acute worsening in signs and symptoms.   Farrell was seen today for depression and anxiety.  Diagnoses and all orders for this visit:  Generalized anxiety disorder -     desvenlafaxine (PRISTIQ) 100 MG 24 hr tablet; Take 2 tablets (200 mg total) by mouth daily. -     ALPRAZolam (XANAX) 1 MG tablet; TAKE 1 TABLET(1 MG) BY MOUTH FOUR TIMES DAILY AS NEEDED FOR ANXIETY -     busPIRone (BUSPAR) 30 MG tablet; TAKE 1 TABLET(30 MG) BY MOUTH TWICE DAILY -     gabapentin (NEURONTIN) 300 MG capsule; Take 1 capsule (300 mg total) by mouth 3 (three) times daily.  Binge eating disorder -     desvenlafaxine (PRISTIQ) 100 MG 24 hr tablet; Take 2 tablets (200 mg total) by mouth daily.  Other obsessive-compulsive disorder -     desvenlafaxine (PRISTIQ) 100 MG 24 hr tablet; Take 2 tablets (200 mg total) by mouth daily.  Insomnia disorder, with non-sleep disorder mental comorbidity, persistent -     ALPRAZolam (XANAX) 1 MG tablet; TAKE 1 TABLET(1 MG) BY MOUTH FOUR TIMES DAILY AS NEEDED FOR ANXIETY -     zolpidem (AMBIEN CR) 12.5 MG CR tablet; Take 1 tablet (12.5 mg total) by mouth at bedtime as needed for sleep.  Mild episode of recurrent major depressive disorder (HCC) -     desvenlafaxine (PRISTIQ) 100 MG 24 hr tablet; Take 2 tablets (200 mg total) by mouth daily. -     Brexpiprazole (REXULTI) 3 MG TABS; Take 1 tablet (3 mg total) by mouth daily.  Nausea and vomiting, unspecified vomiting type -     ondansetron (ZOFRAN) 4 MG tablet; Take 1 tablet (4 mg total) by mouth every 8 (eight) hours as needed for nausea or vomiting.     Please see After Visit Summary for patient specific instructions.  Future Appointments  Date Time Provider Department Center  04/12/2023 11:30 AM Corie Chiquito, PMHNP CP-CP None    No orders of the defined types were placed in this  encounter.   -------------------------------

## 2023-04-04 IMAGING — CT CT MAXILLOFACIAL W/O CM
1 series · 16 of 30 positions shown, 20 images · non-contrast
Comparison: None.

CLINICAL DATA: Chronic sinusitis

EXAM:
CT MAXILLOFACIAL WITHOUT CONTRAST
TECHNIQUE: Multidetector CT imaging of the maxillofacial structures was
performed. Multiplanar CT image reconstructions were also generated.

[Series 4: soft tissue · axial · 0.38mm/px · z∈[-109,+29]mm · 16 of 150 slices shown, 20 images]
[im 6/150  brain]
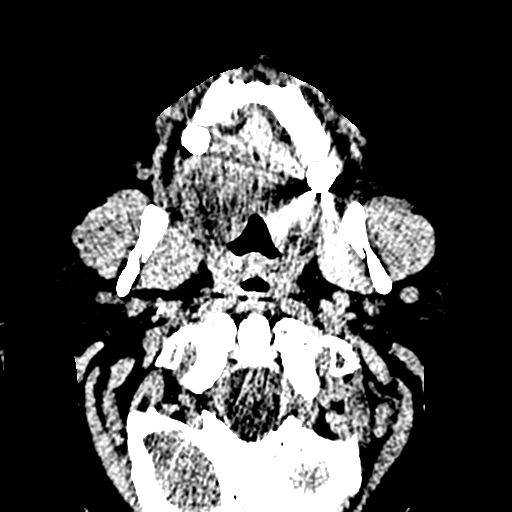
[im 6/150  bone]
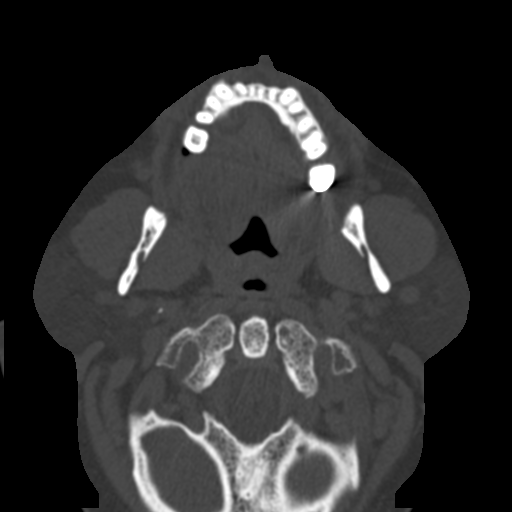
[im 16/150  bone]
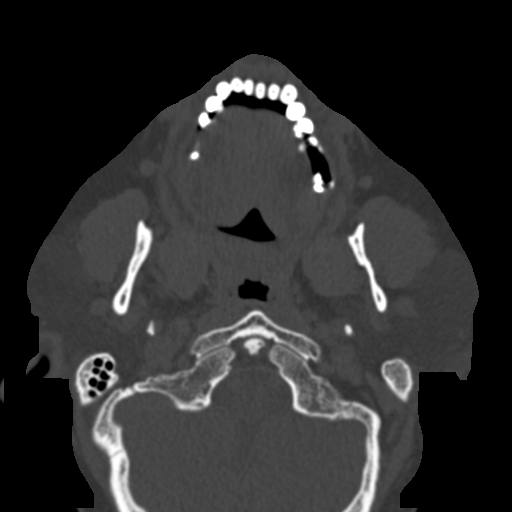
[im 26/150  bone]
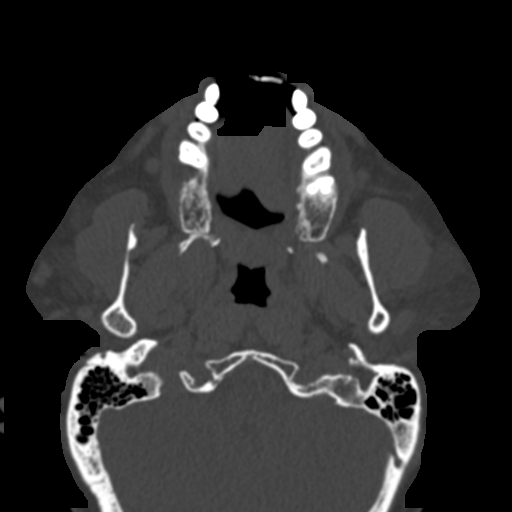
[im 36/150  bone]
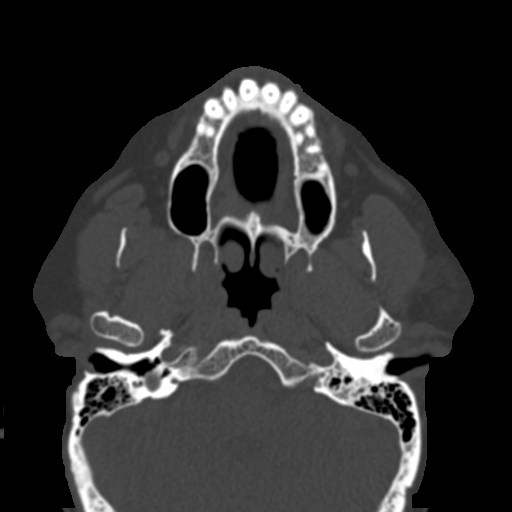
[im 42/150  brain]
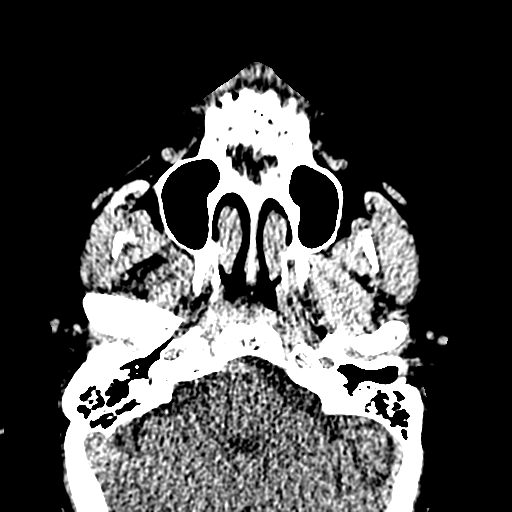
[im 42/150  bone]
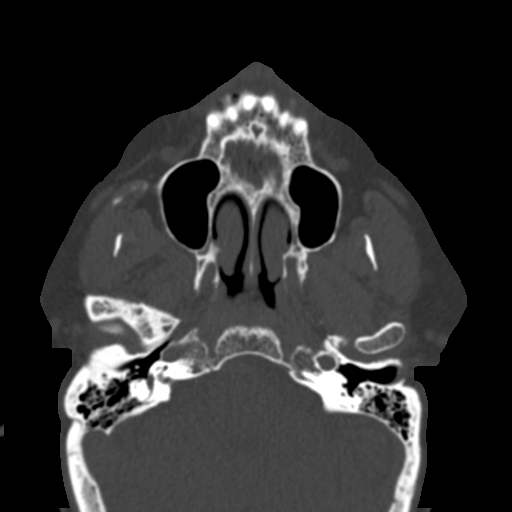
[im 52/150  bone]
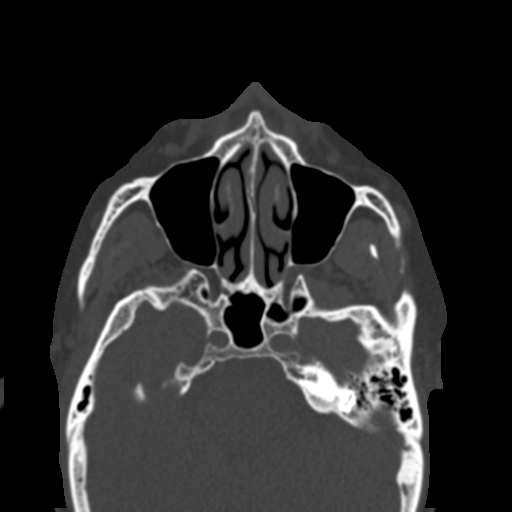
[im 62/150  bone]
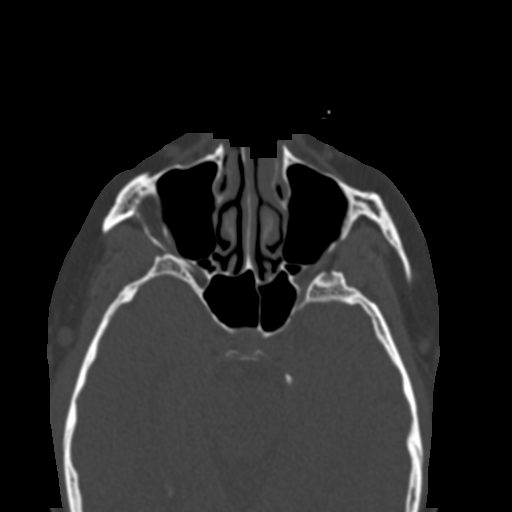
[im 72/150  bone]
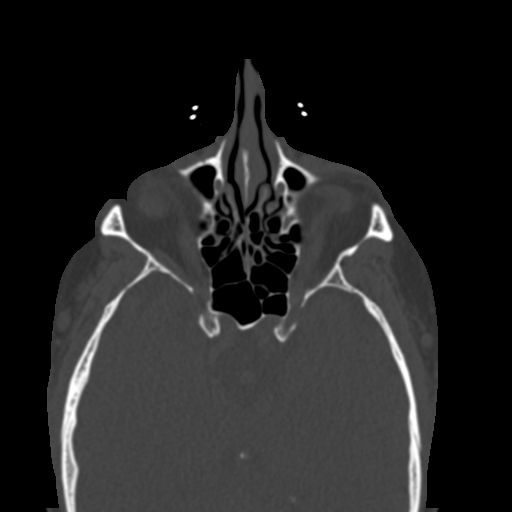
[im 78/150  brain]
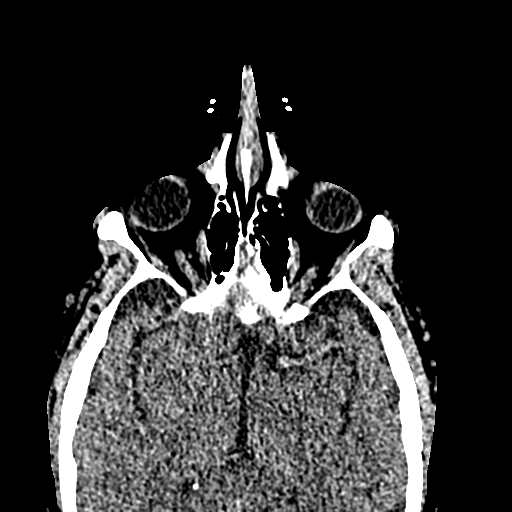
[im 78/150  bone]
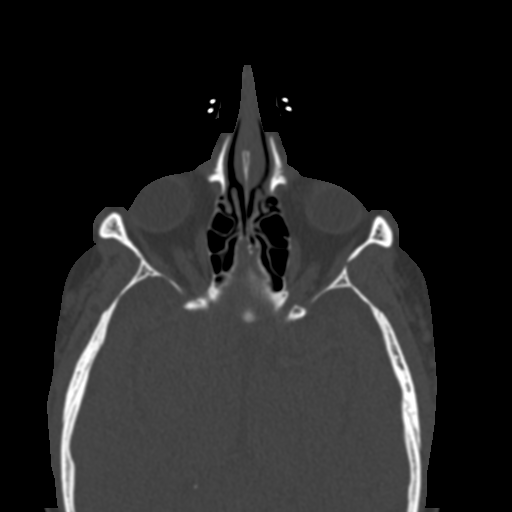
[im 88/150  bone]
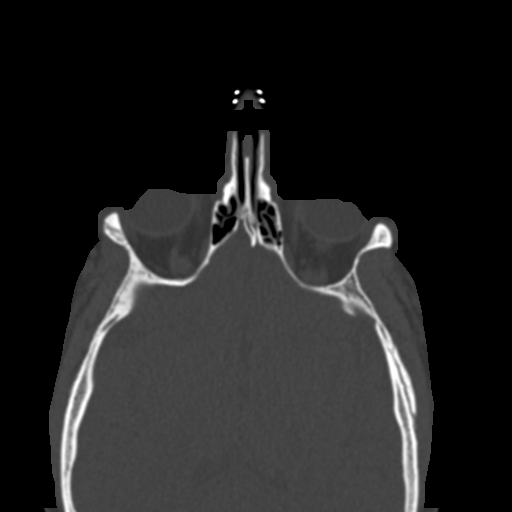
[im 98/150  bone]
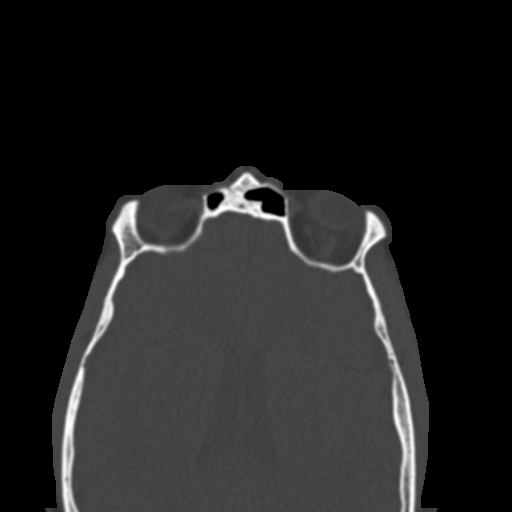
[im 108/150  bone]
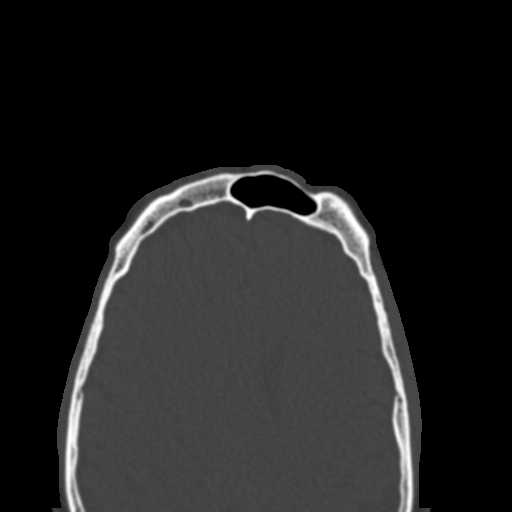
[im 114/150  brain]
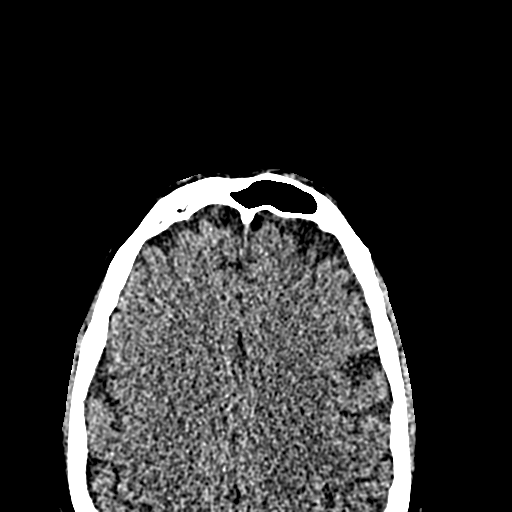
[im 114/150  bone]
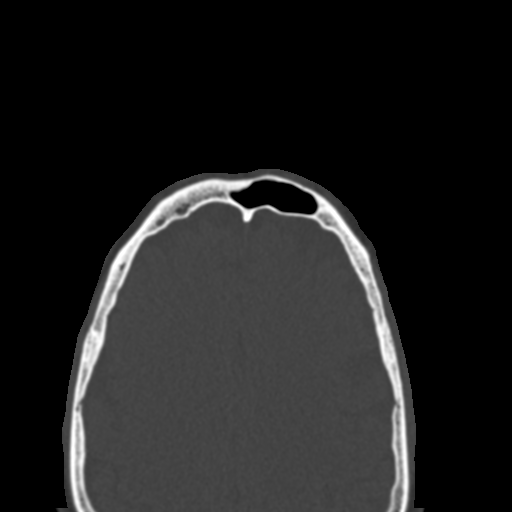
[im 124/150  bone]
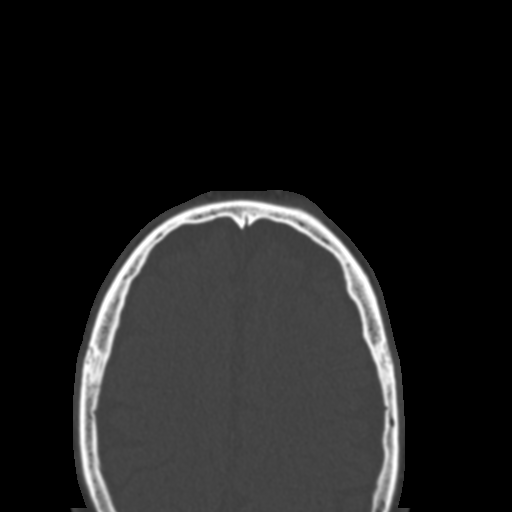
[im 134/150  bone]
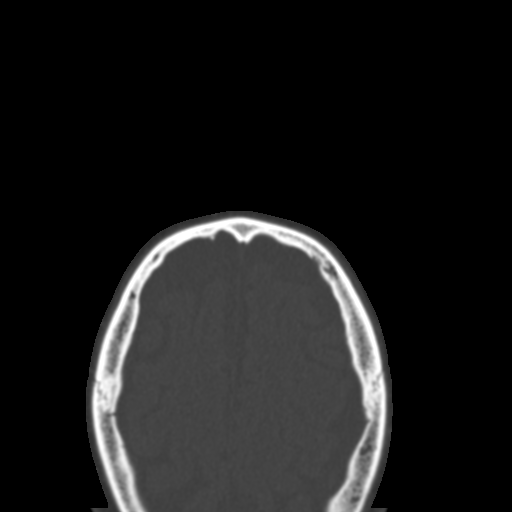
[im 144/150  bone]
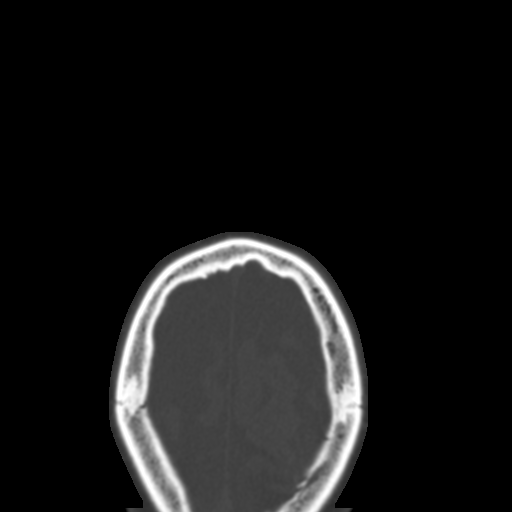

[16 of 30 positions shown; findings below may reference images not displayed]

FINDINGS: Osseous: No fracture or mandibular dislocation. No destructive
process.

Orbits: Negative. No traumatic or inflammatory finding.

Sinuses: The paranasal sinuses are clear. No evidence of osseous
erosion or thickening.

Soft tissues: Negative.

Limited intracranial: No significant or unexpected finding.
IMPRESSION: No evidence of acute or chronic sinusitis. The paranasal sinuses are
clear.

## 2023-04-12 ENCOUNTER — Ambulatory Visit: Payer: 59 | Admitting: Psychiatry

## 2023-04-18 ENCOUNTER — Encounter: Payer: Self-pay | Admitting: Psychiatry

## 2023-04-18 ENCOUNTER — Ambulatory Visit (INDEPENDENT_AMBULATORY_CARE_PROVIDER_SITE_OTHER): Payer: 59 | Admitting: Psychiatry

## 2023-04-18 DIAGNOSIS — F330 Major depressive disorder, recurrent, mild: Secondary | ICD-10-CM

## 2023-04-18 DIAGNOSIS — F5105 Insomnia due to other mental disorder: Secondary | ICD-10-CM

## 2023-04-18 DIAGNOSIS — F5081 Binge eating disorder: Secondary | ICD-10-CM

## 2023-04-18 DIAGNOSIS — R112 Nausea with vomiting, unspecified: Secondary | ICD-10-CM

## 2023-04-18 DIAGNOSIS — F411 Generalized anxiety disorder: Secondary | ICD-10-CM

## 2023-04-18 DIAGNOSIS — F428 Other obsessive-compulsive disorder: Secondary | ICD-10-CM

## 2023-04-18 MED ORDER — ALPRAZOLAM 1 MG PO TABS
ORAL_TABLET | ORAL | 1 refills | Status: DC
Start: 2023-06-23 — End: 2023-07-18

## 2023-04-18 MED ORDER — ONDANSETRON HCL 4 MG PO TABS: 4.0000 mg | ORAL_TABLET | Freq: Three times a day (TID) | ORAL | 5 refills | Status: AC | PRN

## 2023-04-18 MED ORDER — BUSPIRONE HCL 30 MG PO TABS
ORAL_TABLET | ORAL | 1 refills | Status: DC
Start: 2023-04-18 — End: 2023-07-18

## 2023-04-18 MED ORDER — DESVENLAFAXINE SUCCINATE ER 100 MG PO TB24: 200.0000 mg | ORAL_TABLET | Freq: Every day | ORAL | 1 refills | Status: DC

## 2023-04-18 MED ORDER — REXULTI 3 MG PO TABS
3.0000 mg | ORAL_TABLET | Freq: Every day | ORAL | 1 refills | Status: DC
Start: 2023-04-18 — End: 2023-07-18

## 2023-04-18 NOTE — Progress Notes (Unsigned)
Eppie Pule [id number] [birth year] 52 y.o.  Subjective:   Patient ID:  Mario Briggs is a 52 y.o. (DOB 1970/10/10) male.  Chief Complaint:  Chief Complaint  Patient presents with   Anxiety    HPI Mario Briggs presents to the office today for follow-up of anxiety, depression, and insomnia.   He reports, "I got fired yesterday." He reports, "I wasn't expecting it." He reports that he thinks that he is still in shock.   He reports, "the depression, I think has been pretty good." He reports that his anxiety has been ok and anticipates this possibly changing when he processes job loss. He reports having a couple of panic attacks daily long-term. He reports that his sleep is good aside from being awakened by lower back pain. He reports that he has been consistently wearing cPap and thinks this is helpful for sleep quality. Sleeping 8 hours, interrupted. Low energy and motivation. Concentration has been adequate. Denies change in appetite. Denies SI.   Wife continues to recover from CVA. Reports wife has been having multiple medical appointments.   Reports Zofran prn has been effective.   Ambien CR last filled 04/15/23. Alprazolam prn 03/31/23.  Gabapentin last filled 03/09/23.   Past Psychiatric Medication Trials: Viibryd-Felt that it may have become less effective over time. Pristiq Clomipramine Wellbutrin XL Vraylar Rexulti Latuda Xanax Ambien Ambien CR Trazodone Buspar-Initially helpful Vyvanse- Somewhat helpful for concentration and energy. May cause some restlessness. Gabapentin AIMS    Flowsheet Row Office Visit from 04/18/2023 in [facility] Office Visit from 01/11/2023 in [facility] Office Visit from 07/13/2022 in [facility] Office Visit from 04/27/2022 in [facility] Office Visit from 01/21/2022 in [facility]  AIMS Total Score 3 0 0  0 0        Review of Systems:  Review of Systems  Musculoskeletal:  Positive for back pain.  Neurological:        Occ mild tremor    Medications: I have reviewed the patient's current medications.  Current Outpatient Medications  Medication Sig Dispense Refill   Aspirin Effervescent (ALKA-SELTZER PO) Take by mouth.     cholecalciferol (VITAMIN D3) 25 MCG (1000 UNIT) tablet Take 1,000 Units by mouth 2 (two) times daily.     fexofenadine (ALLEGRA) 60 MG tablet Take 60 mg by mouth daily.     fluticasone (FLONASE) 50 MCG/ACT nasal spray SMARTSIG:1-2 Spray(s) Both Nares Daily     gabapentin (NEURONTIN) 300 MG capsule Take 1 capsule (300 mg total) by mouth 3 (three) times daily. 270 capsule 1   loratadine (CLARITIN) 10 MG tablet Take 10 mg by mouth daily as needed for allergies.     montelukast (SINGULAIR) 10 MG tablet Take 1 tablet by mouth daily.     Multiple Vitamin (MULTIVITAMIN) tablet Take 1 tablet by mouth daily.     Saw Palmetto, Serenoa repens, (SAW PALMETTO PO) Take by mouth.     vitamin E 1000 UNIT capsule Take 2,000 Units by mouth daily.     zolpidem (AMBIEN CR) 12.5 MG CR tablet Take 1 tablet (12.5 mg total) by mouth at bedtime as needed for sleep. 30 tablet 5   [START ON 06/23/2023] ALPRAZolam (XANAX) 1 MG tablet TAKE 1 TABLET(1 MG) BY MOUTH FOUR TIMES DAILY AS NEEDED FOR ANXIETY 360 tablet 1   Brexpiprazole (REXULTI) 3 MG TABS Take 1 tablet (3 mg total) by mouth daily. 90 tablet 1  busPIRone (BUSPAR) 30 MG tablet TAKE 1 TABLET(30 MG) BY MOUTH TWICE DAILY 180 tablet 1   desvenlafaxine (PRISTIQ) 100 MG 24 hr tablet Take 2 tablets (200 mg total) by mouth daily. 180 tablet 1   finasteride (PROSCAR) 5 MG tablet Take 5 mg by mouth. (Patient not taking: Reported on 04/18/2023)     lisinopril (ZESTRIL) 10 MG tablet Take 10 mg by mouth daily. (Patient not taking: Reported on 01/11/2023)     Olopatadine HCl 0.6 % SOLN Place into both nostrils. (Patient not taking: Reported on  04/27/2022)     ondansetron (ZOFRAN) 4 MG tablet Take 1 tablet (4 mg total) by mouth every 8 (eight) hours as needed for nausea or vomiting. 30 tablet 5   pseudoephedrine (SUDAFED) 30 MG tablet Take 30 mg by mouth every 4 (four) hours as needed for congestion. (Patient not taking: Reported on 01/21/2022)     tadalafil (CIALIS) 5 MG tablet Take 5 mg by mouth daily. (Patient not taking: Reported on 04/18/2023)     tamsulosin (FLOMAX) 0.4 MG CAPS capsule Take 0.4 mg by mouth. (Patient not taking: Reported on 01/11/2023)     No current facility-administered medications for this visit.    Medication Side Effects: Other: tremors  Allergies: No Known Allergies  History reviewed. No pertinent past medical history.  Past Medical History, Surgical history, Social history, and Family history were reviewed and updated as appropriate.   Please see review of systems for further details on the patient's review from today.   Objective:   Physical Exam:  There were no vitals taken for this visit.  Physical Exam Constitutional:      General: He is not in acute distress. Musculoskeletal:        General: No deformity.  Neurological:     Mental Status: He is alert and oriented to person, place, and time.     Coordination: Coordination normal.  Psychiatric:        Attention and Perception: Attention and perception normal. He does not perceive auditory or visual hallucinations.        Mood and Affect: Mood is not depressed. Affect is not labile, blunt, angry or inappropriate.        Speech: Speech normal.        Behavior: Behavior normal.        Thought Content: Thought content normal. Thought content is not paranoid or delusional. Thought content does not include homicidal or suicidal ideation. Thought content does not include homicidal or suicidal plan.        Cognition and Memory: Cognition and memory normal.        Judgment: Judgment normal.     Comments: Insight intact Mood is mildly anxious about  job loss     Lab Review:     Component Value Date/Time   NA 142 03/03/2021 1339   K 4.7 03/03/2021 1339   CL 105 03/03/2021 1339   CO2 27 03/03/2021 1339   GLUCOSE 95 03/03/2021 1339   BUN 10 03/03/2021 1339   CREATININE 1.21 03/03/2021 1339   CALCIUM 9.7 03/03/2021 1339   PROT 6.8 03/03/2021 1339   AST 20 03/03/2021 1339   ALT 25 03/03/2021 1339   BILITOT 0.3 03/03/2021 1339    No results found for: "WBC", "RBC", "HGB", "HCT", "PLT", "MCV", "MCH", "MCHC", "RDW", "LYMPHSABS", "MONOABS", "EOSABS", "BASOSABS"  No results found for: "POCLITH", "LITHIUM"   No results found for: "PHENYTOIN", "PHENOBARB", "VALPROATE", "CBMZ"   .res Assessment: Plan:  Janoah was seen today for anxiety.  Diagnoses and all orders for this visit:  Nausea and vomiting, unspecified vomiting type -     ondansetron (ZOFRAN) 4 MG tablet; Take 1 tablet (4 mg total) by mouth every 8 (eight) hours as needed for nausea or vomiting.  Generalized anxiety disorder -     ALPRAZolam (XANAX) 1 MG tablet; TAKE 1 TABLET(1 MG) BY MOUTH FOUR TIMES DAILY AS NEEDED FOR ANXIETY -     busPIRone (BUSPAR) 30 MG tablet; TAKE 1 TABLET(30 MG) BY MOUTH TWICE DAILY -     desvenlafaxine (PRISTIQ) 100 MG 24 hr tablet; Take 2 tablets (200 mg total) by mouth daily.  Insomnia disorder, with non-sleep disorder mental comorbidity, persistent -     ALPRAZolam (XANAX) 1 MG tablet; TAKE 1 TABLET(1 MG) BY MOUTH FOUR TIMES DAILY AS NEEDED FOR ANXIETY  Mild episode of recurrent major depressive disorder (HCC) -     Brexpiprazole (REXULTI) 3 MG TABS; Take 1 tablet (3 mg total) by mouth daily. -     desvenlafaxine (PRISTIQ) 100 MG 24 hr tablet; Take 2 tablets (200 mg total) by mouth daily.  Binge eating disorder -     desvenlafaxine (PRISTIQ) 100 MG 24 hr tablet; Take 2 tablets (200 mg total) by mouth daily.  Other obsessive-compulsive disorder -     desvenlafaxine (PRISTIQ) 100 MG 24 hr tablet; Take 2 tablets (200 mg total) by  mouth daily.     Please see After Visit Summary for patient specific instructions.  Future Appointments  Date Time Provider Department Center  07/18/2023 11:30 AM Corie Chiquito, PMHNP [facility] None    No orders of the defined types were placed in this encounter.   -------------------------------

## 2023-06-14 ENCOUNTER — Encounter: Payer: Self-pay | Admitting: Psychiatry

## 2023-07-18 ENCOUNTER — Encounter: Payer: Self-pay | Admitting: Psychiatry

## 2023-07-18 ENCOUNTER — Ambulatory Visit (INDEPENDENT_AMBULATORY_CARE_PROVIDER_SITE_OTHER): Payer: 59 | Admitting: Psychiatry

## 2023-07-18 DIAGNOSIS — F5105 Insomnia due to other mental disorder: Secondary | ICD-10-CM

## 2023-07-18 DIAGNOSIS — F428 Other obsessive-compulsive disorder: Secondary | ICD-10-CM

## 2023-07-18 DIAGNOSIS — F330 Major depressive disorder, recurrent, mild: Secondary | ICD-10-CM

## 2023-07-18 DIAGNOSIS — F411 Generalized anxiety disorder: Secondary | ICD-10-CM

## 2023-07-18 MED ORDER — REXULTI 3 MG PO TABS: 3.0000 mg | ORAL_TABLET | Freq: Every day | ORAL | 1 refills | Status: AC

## 2023-07-18 MED ORDER — GABAPENTIN 300 MG PO CAPS: 300.0000 mg | ORAL_CAPSULE | Freq: Three times a day (TID) | ORAL | 1 refills | Status: AC

## 2023-07-18 MED ORDER — ZOLPIDEM TARTRATE ER 12.5 MG PO TBCR: 12.5000 mg | EXTENDED_RELEASE_TABLET | Freq: Every evening | ORAL | 5 refills | Status: AC | PRN

## 2023-07-18 MED ORDER — DESVENLAFAXINE SUCCINATE ER 100 MG PO TB24: 200.0000 mg | ORAL_TABLET | Freq: Every day | ORAL | 1 refills | Status: AC

## 2023-07-18 MED ORDER — ALPRAZOLAM 1 MG PO TABS
ORAL_TABLET | ORAL | 1 refills | Status: AC
Start: 2023-09-25 — End: ?

## 2023-07-18 MED ORDER — BUSPIRONE HCL 30 MG PO TABS: ORAL_TABLET | ORAL | 1 refills | Status: AC

## 2023-07-18 NOTE — Progress Notes (Signed)
Mario Briggs [id number] 1970/08/06 52 y.o.  Subjective:   Patient ID:  Mario Briggs is a 52 y.o. (DOB [birth year]) male.  Chief Complaint:  Chief Complaint  Patient presents with   Follow-up    Depression, anxiety, and insomnia    HPI Mario Briggs presents to the office today for follow-up of depression, anxiety, and insomnia.   His wife died in 05/30/23 from a heart attack. He was trying to transport her to the hospital when it happened- "we had to perform CPR." He also had to make decision to take her off of ventilator. Denies re-experiencing. Denies exaggerated startle response. He reports, "I miss her terribly. She was my best friend." He reports grief and depression. He reports increased anxiety. Reports worry about finding work. Denies panic attacks. He reports, "I sleep a lot." Sleeping up to 12 hours a day. Energy and motivation have been low. Appetite has been ok. He reports that he has lost a few pounds. "Sometimes I'm not even motivated to fix something to eat." Denies SI.   He has been looking for a new job. He reports that he currently is without insurance and will not be able to afford Rexulti.   He reports that his mother has been supportive. Sister has also been helpful.   Alprazolam last filled 07/03/23.  Ambien CR last filled 06/13/23 x 3.  Gabapentin last filled 06/06/23 x 2  Past Psychiatric Medication Trials: Viibryd-Felt that it may have become less effective over time. Pristiq Clomipramine Wellbutrin XL Vraylar Rexulti Latuda Xanax Ambien Ambien CR Trazodone Buspar-Initially helpful Vyvanse- Somewhat helpful for concentration and energy. May cause some restlessness. Gabapentin  AIMS    Flowsheet Row Office Visit from 07/18/2023 in [facility] Office Visit from 04/18/2023 in [facility] Office Visit from 01/11/2023 in [facility] Visit from 07/13/2022 in [facility] Office Visit from 04/27/2022 in [facility]  AIMS Total Score 0 3 0 0 0        Review of Systems:  Review of Systems  Cardiovascular:  Negative for palpitations.  Musculoskeletal:  Negative for gait problem.  Neurological:  Negative for tremors.  Psychiatric/Behavioral:         Please refer to HPI    Medications: I have reviewed the patient's current medications.  Current Outpatient Medications  Medication Sig Dispense Refill   Ascorbic Acid (VITAMIN C) 1000 MG tablet Take 1,000 mg by mouth daily.     cholecalciferol (VITAMIN D3) 25 MCG (1000 UNIT) tablet Take 1,000 Units by mouth 2 (two) times daily.     fexofenadine (ALLEGRA) 60 MG tablet Take 60 mg by mouth daily.     fluticasone (FLONASE) 50 MCG/ACT nasal spray SMARTSIG:1-2 Spray(s) Both Nares Daily     loratadine (CLARITIN) 10 MG tablet Take 10 mg by mouth daily as needed for allergies.     montelukast (SINGULAIR) 10 MG tablet Take 1 tablet by mouth daily.     Multiple Vitamin (MULTIVITAMIN) tablet Take 1 tablet by mouth daily.     ondansetron (ZOFRAN) 4 MG tablet Take 1 tablet (4 mg total) by mouth every 8 (eight) hours as needed for nausea or vomiting. 30 tablet 5   Potassium 99 MG TABS Take by mouth.     Saw Palmetto, Serenoa repens, (SAW PALMETTO PO) Take by mouth.     [START ON 09/25/2023] ALPRAZolam (XANAX) 1 MG tablet TAKE 1 TABLET(1 MG) BY MOUTH  FOUR TIMES DAILY AS NEEDED FOR ANXIETY 360 tablet 1   Aspirin Effervescent (ALKA-SELTZER PO) Take by mouth. (Patient not taking: Reported on 07/18/2023)     Brexpiprazole (REXULTI) 3 MG TABS Take 1 tablet (3 mg total) by mouth daily. 90 tablet 1   busPIRone (BUSPAR) 30 MG tablet TAKE 1 TABLET(30 MG) BY MOUTH TWICE DAILY 180 tablet 1   desvenlafaxine (PRISTIQ) 100 MG 24 hr tablet Take 2 tablets (200 mg total) by mouth daily. 180 tablet 1   finasteride (PROSCAR) 5 MG tablet Take 5 mg by mouth. (Patient not taking:  Reported on 04/18/2023)     [START ON 08/29/2023] gabapentin (NEURONTIN) 300 MG capsule Take 1 capsule (300 mg total) by mouth 3 (three) times daily. 270 capsule 1   lisinopril (ZESTRIL) 10 MG tablet Take 10 mg by mouth daily. (Patient not taking: Reported on 01/11/2023)     Olopatadine HCl 0.6 % SOLN Place into both nostrils. (Patient not taking: Reported on 04/27/2022)     pseudoephedrine (SUDAFED) 30 MG tablet Take 30 mg by mouth every 4 (four) hours as needed for congestion. (Patient not taking: Reported on 01/21/2022)     tadalafil (CIALIS) 5 MG tablet Take 5 mg by mouth daily. (Patient not taking: Reported on 04/18/2023)     tamsulosin (FLOMAX) 0.4 MG CAPS capsule Take 0.4 mg by mouth. (Patient not taking: Reported on 01/11/2023)     vitamin E 1000 UNIT capsule Take 2,000 Units by mouth daily. (Patient not taking: Reported on 07/18/2023)     zolpidem (AMBIEN CR) 12.5 MG CR tablet Take 1 tablet (12.5 mg total) by mouth at bedtime as needed for sleep. 30 tablet 5   No current facility-administered medications for this visit.    Medication Side Effects: None  Allergies: No Known Allergies  History reviewed. No pertinent past medical history.  Past Medical History, Surgical history, Social history, and Family history were reviewed and updated as appropriate.   Please see review of systems for further details on the patient's review from today.   Objective:   Physical Exam:  BP 115/76   Pulse 92   Physical Exam Constitutional:      General: He is not in acute distress. Musculoskeletal:        General: No deformity.  Neurological:     Mental Status: He is alert and oriented to person, place, and time.     Coordination: Coordination normal.  Psychiatric:        Attention and Perception: Attention and perception normal. He does not perceive auditory or visual hallucinations.        Mood and Affect: Affect is not labile, blunt, angry or inappropriate.        Speech: Speech normal.         Behavior: Behavior normal.        Thought Content: Thought content normal. Thought content is not paranoid or delusional. Thought content does not include homicidal or suicidal ideation. Thought content does not include homicidal or suicidal plan.        Cognition and Memory: Cognition and memory normal.        Judgment: Judgment normal.     Comments: Insight intact Mood is appropriate to content and affect is congruent     Lab Review:     Component Value Date/Time   NA 142 03/03/2021 1339   K 4.7 03/03/2021 1339   CL 105 03/03/2021 1339   CO2 27 03/03/2021 1339   GLUCOSE 95 03/03/2021 1339  BUN 10 03/03/2021 1339   CREATININE 1.21 03/03/2021 1339   CALCIUM 9.7 03/03/2021 1339   PROT 6.8 03/03/2021 1339   AST 20 03/03/2021 1339   ALT 25 03/03/2021 1339   BILITOT 0.3 03/03/2021 1339    No results found for: "WBC", "RBC", "HGB", "HCT", "PLT", "MCV", "MCH", "MCHC", "RDW", "LYMPHSABS", "MONOABS", "EOSABS", "BASOSABS"  No results found for: "POCLITH", "LITHIUM"   No results found for: "PHENYTOIN", "PHENOBARB", "VALPROATE", "CBMZ"   .res Assessment: Plan:    Pt reports that he would like to continue current medications without changes since mood and anxiety symptoms have been manageable during severe stressors and loss of his wife.  Will continue Pristiq 200 mg daily for depression and anxiety.  Continue Rexulti 3 mg daily for depression. Discussed that he may be eligible for patient assistance. Provided pt with samples.  Continue Buspar 30 mg po BID for anxiety.  Continue Gabapentin 300 mg TID for anxiety.  Continue Ambien CR 12.5 mg po at bedtime prn insomnia.  Continue Alprazolam 1 mg four times daily as needed for anxiety.  Pt to follow-up in 3 months or sooner if clinically indicated.  Patient advised to contact office with any questions, adverse effects, or acute worsening in signs and symptoms.   Tano was seen today for follow-up.  Diagnoses and all orders for  this visit:  Generalized anxiety disorder -     ALPRAZolam (XANAX) 1 MG tablet; TAKE 1 TABLET(1 MG) BY MOUTH FOUR TIMES DAILY AS NEEDED FOR ANXIETY -     busPIRone (BUSPAR) 30 MG tablet; TAKE 1 TABLET(30 MG) BY MOUTH TWICE DAILY -     desvenlafaxine (PRISTIQ) 100 MG 24 hr tablet; Take 2 tablets (200 mg total) by mouth daily. -     gabapentin (NEURONTIN) 300 MG capsule; Take 1 capsule (300 mg total) by mouth 3 (three) times daily.  Insomnia disorder, with non-sleep disorder mental comorbidity, persistent -     ALPRAZolam (XANAX) 1 MG tablet; TAKE 1 TABLET(1 MG) BY MOUTH FOUR TIMES DAILY AS NEEDED FOR ANXIETY -     zolpidem (AMBIEN CR) 12.5 MG CR tablet; Take 1 tablet (12.5 mg total) by mouth at bedtime as needed for sleep.  Mild episode of recurrent major depressive disorder (HCC) -     Brexpiprazole (REXULTI) 3 MG TABS; Take 1 tablet (3 mg total) by mouth daily. -     desvenlafaxine (PRISTIQ) 100 MG 24 hr tablet; Take 2 tablets (200 mg total) by mouth daily.  Binge eating disorder -     desvenlafaxine (PRISTIQ) 100 MG 24 hr tablet; Take 2 tablets (200 mg total) by mouth daily.  Other obsessive-compulsive disorder -     desvenlafaxine (PRISTIQ) 100 MG 24 hr tablet; Take 2 tablets (200 mg total) by mouth daily.     Please see After Visit Summary for patient specific instructions.  No future appointments.  No orders of the defined types were placed in this encounter.   -------------------------------

## 2024-07-09 ENCOUNTER — Ambulatory Visit: Admitting: Family Medicine
